# Patient Record
Sex: Female | Born: 1995 | Race: Black or African American | Hispanic: No | Marital: Single | Smoking: Never smoker
Health system: Southern US, Community
[De-identification: ages and names within clinical notes are randomized; demographics above are authoritative.]

## PROBLEM LIST (undated history)

## (undated) ENCOUNTER — Inpatient Hospital Stay (HOSPITAL_COMMUNITY): Payer: Self-pay

## (undated) DIAGNOSIS — J45909 Unspecified asthma, uncomplicated: Secondary | ICD-10-CM

## (undated) DIAGNOSIS — O24419 Gestational diabetes mellitus in pregnancy, unspecified control: Secondary | ICD-10-CM

## (undated) HISTORY — DX: Gestational diabetes mellitus in pregnancy, unspecified control: O24.419

## (undated) HISTORY — PX: NO PAST SURGERIES: SHX2092

---
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
## 2017-03-25 IMAGING — CR DG ANKLE COMPLETE 3+V*R*
1 series · 3 of 3 positions shown · non-contrast
Comparison: None.

CLINICAL DATA: Increasing right ankle pain with swelling after a
fall playing volleyball

EXAM:
RIGHT ANKLE - COMPLETE 3+ VIEW

[Series 1: x ankle ap right · 0.14mm/px · 3 of 3 slices shown]
[im 1/3]
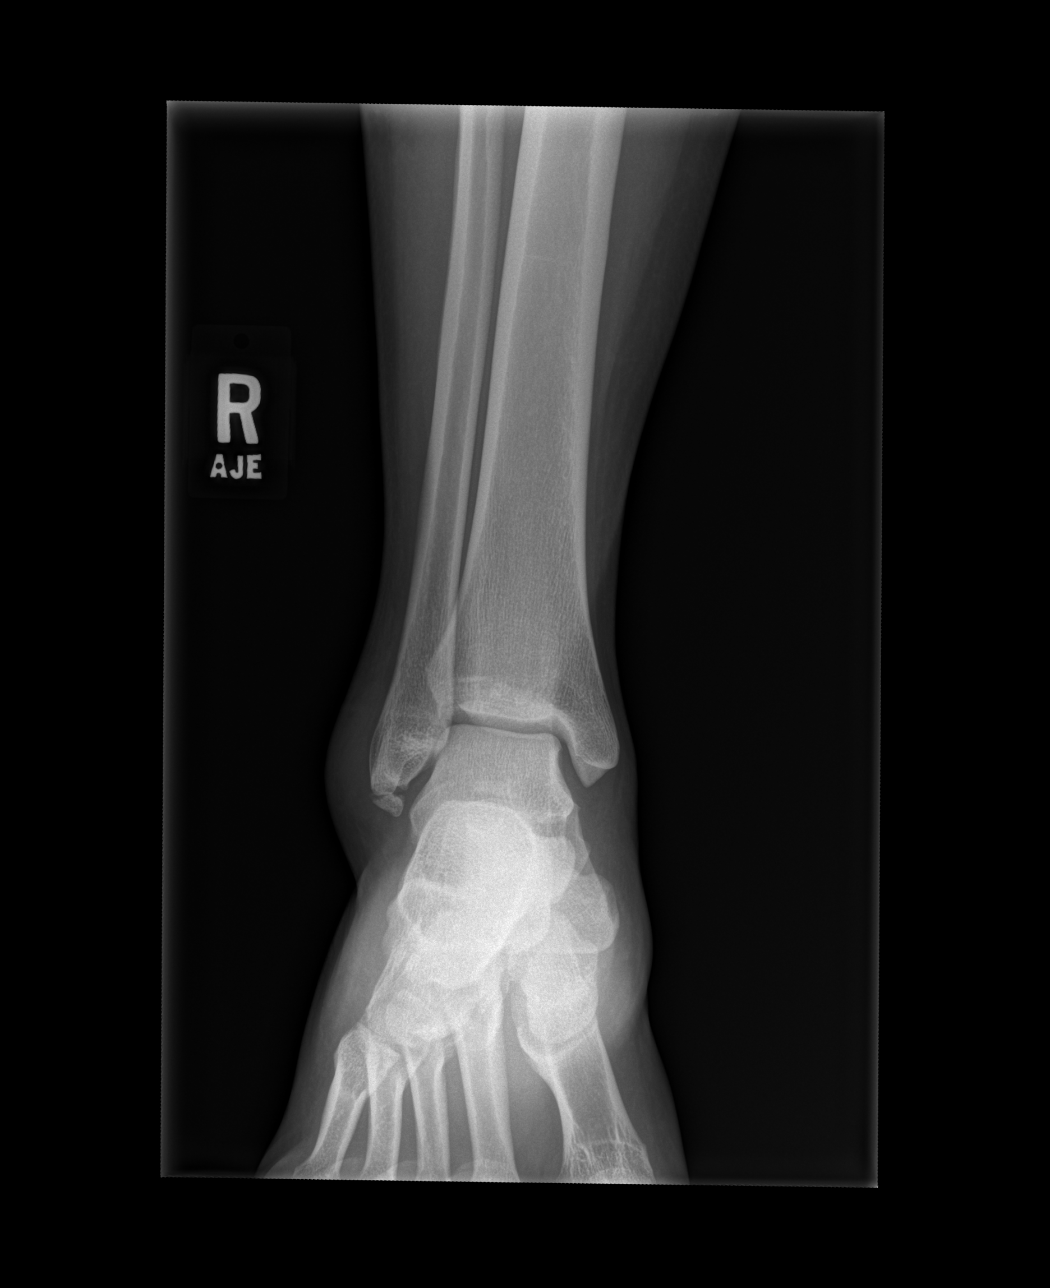
[im 2/3]
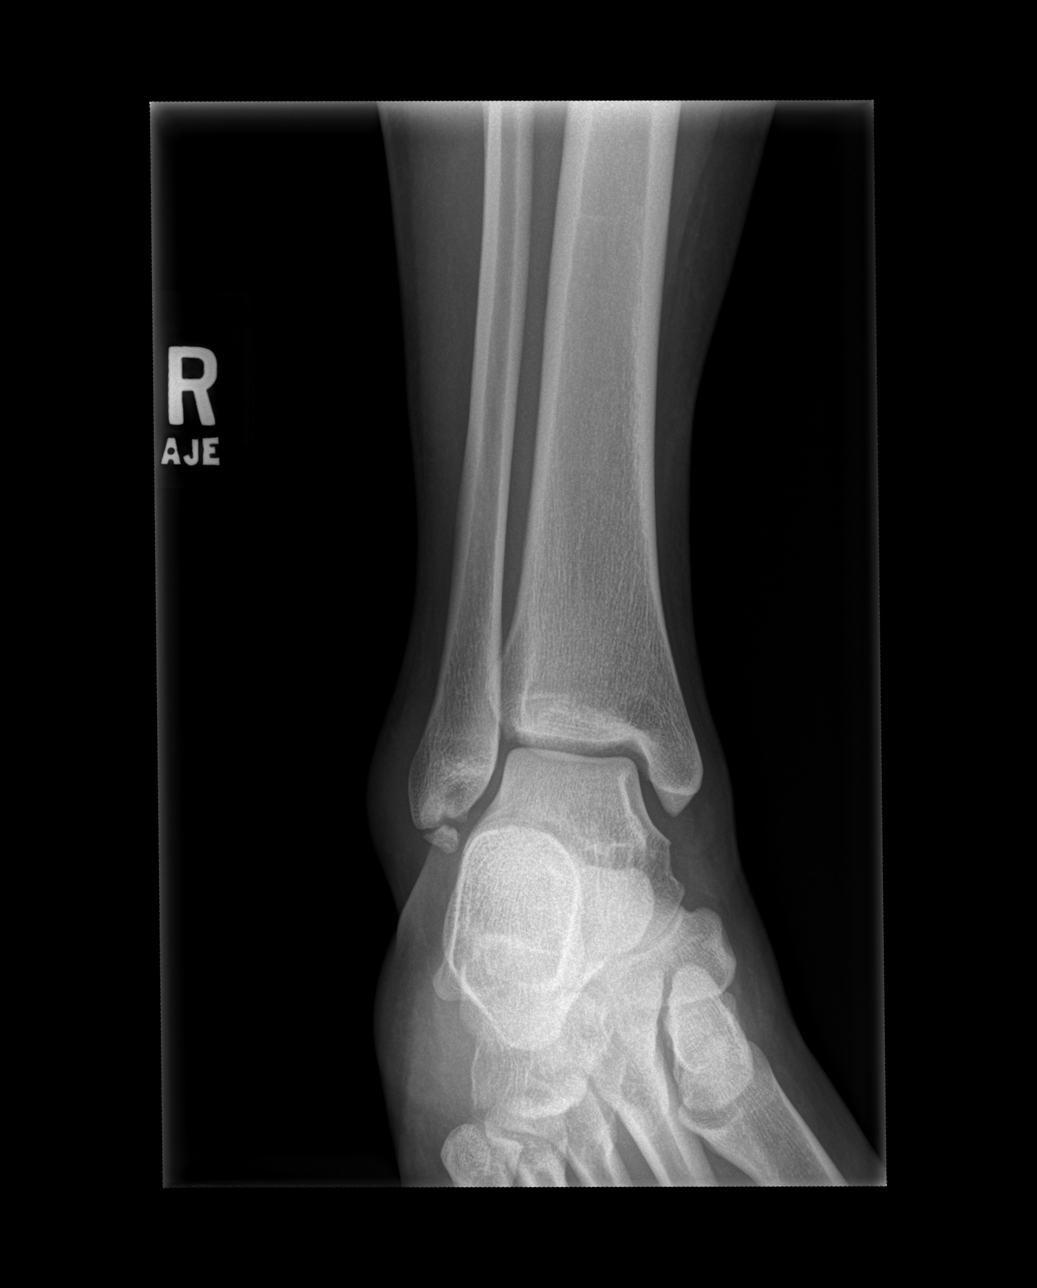
[im 3/3]
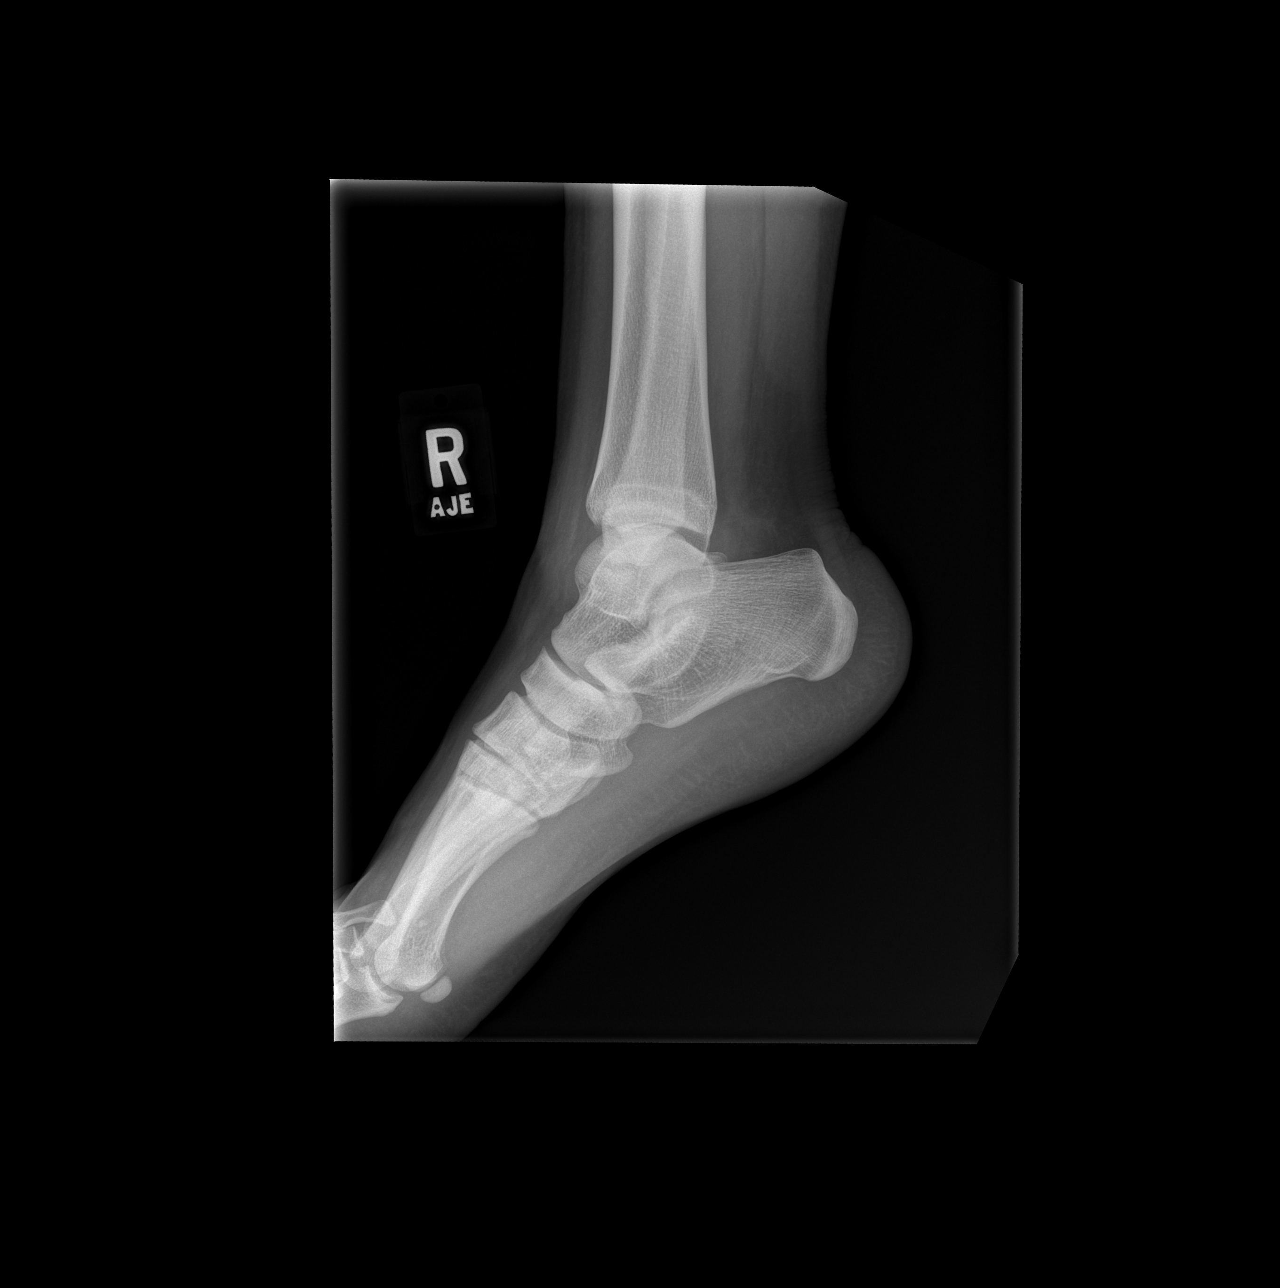

[3 of 3 positions shown; findings below may reference images not displayed]

FINDINGS: There is an avulsion fracture of the tip of the distal right fibula
with adjacent soft tissue swelling. The medial malleolus is
unremarkable. The ankle joint appears normal, and alignment is
normal.
IMPRESSION: Avulsion fracture of the tip the distal right fibula.

## 2019-03-04 ENCOUNTER — Other Ambulatory Visit: Payer: Self-pay

## 2019-03-04 ENCOUNTER — Ambulatory Visit
Admission: EM | Admit: 2019-03-04 | Discharge: 2019-03-04 | Disposition: A | Discharge status: Home / Self Care | Payer: BLUE CROSS/BLUE SHIELD | Attending: Family Medicine | Admitting: Family Medicine

## 2019-03-04 DIAGNOSIS — T23201A Burn of second degree of right hand, unspecified site, initial encounter: Secondary | ICD-10-CM

## 2019-03-04 DIAGNOSIS — X19XXXA Contact with other heat and hot substances, initial encounter: Secondary | ICD-10-CM

## 2019-03-04 MED ORDER — SILVER SULFADIAZINE 1 % EX CREA: 1.0000 "application " | TOPICAL_CREAM | Freq: Two times a day (BID) | CUTANEOUS | 0 refills | Status: DC

## 2019-03-04 NOTE — ED Triage Notes (Signed)
Patient complains of burn to her hand around 10pm. States that she was cleaning the grill at work and some of the cleaner splashed back on to her right index and pinky finger. Patient has a large blister and redness to her hand.

## 2019-03-04 NOTE — Discharge Instructions (Signed)
Medication as directed. ° °Take care ° °Dr. Daily Crate  °

## 2019-03-04 NOTE — ED Provider Notes (Signed)
[facility]    CSN: [id number] Arrival date & time: 03/04/19  1209  History   Chief Complaint Chief Complaint  Patient presents with  . Burn   HPI  23 year old female presents with a burn to her right hand which occurred at work.  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  Patient reports that last night around 10 PM she was cleaning a grill at work.  The burn was hot and some of the cleaner splashed back and burned her right hand (interdigital space between the fourth and fifth digits and on the dorsum of the fifth digit).  Patient has blisters present.  Associated redness.  Associated severe pain.  No fever.  No medications or interventions tried.  No other associated symptoms.  No other complaints  History reviewed and updated as below. PMH: Obesity, Acne  Past Surgical History:  Procedure Laterality Date  . NO PAST SURGERIES     OB History   No obstetric history on file.    Home Medications    Prior to Admission medications   Medication Sig Start Date End Date Taking? Authorizing Provider  minocycline (MINOCIN) 100 MG capsule Take 100 mg by mouth 2 (two) times daily. 02/18/19  Yes [provider]  tretinoin (RETIN-A) 0.025 % cream APPLY TOPICALLY TO FACE NIGHTLY. IF YOU ARE HAVING A LOT OF IRRITATION APPLY ONLY EVERY OTHER NIGHT 02/16/19  Yes [provider]  triamcinolone cream (KENALOG) 0.1 % APPLY TOPICALLY FOR AREAS OF ECZEMA UP TO TWICE DAILY AS NEEDED ONLY 02/16/19  Yes [provider]  silver sulfADIAZINE (SILVADENE) 1 % cream Apply 1 application topically 2 (two) times daily. 03/04/19   Coral Spikes, DO   Social History Social History   Tobacco Use  . Smoking status: Never Smoker  . Smokeless tobacco: Never Used  Substance Use Topics  . Alcohol use: Not Currently  . Drug use: Not Currently    Allergies   Patient has no known allergies.  Review of Systems Review of Systems  Constitutional: Negative.   Skin:       Burn  (right hand)   Physical Exam Triage Vital Signs ED Triage Vitals  Enc Vitals Group     BP 03/04/19 1226 121/85     Pulse Rate 03/04/19 1226 (!) 57     Resp 03/04/19 1226 18     Temp 03/04/19 1226 98.4 F (36.9 C)     Temp Source 03/04/19 1226 Oral     SpO2 03/04/19 1226 100 %     Weight 03/04/19 1223 215 lb (97.5 kg)     Height 03/04/19 1223 5\' 8"  (1.727 m)     Head Circumference --      Peak Flow --      Pain Score 03/04/19 1223 10     Pain Loc --      Pain Edu? --      Excl. in [place]? --    Updated Vital Signs BP 121/85 (BP Location: Left Arm)   Pulse (!) 57   Temp 98.4 F (36.9 C) (Oral)   Resp 18   Ht 5\' 8"  (1.727 m)   Wt 97.5 kg   LMP 02/25/2019   SpO2 100%   BMI 32.69 kg/m   Visual Acuity Right Eye Distance:   Left Eye Distance:   Bilateral Distance:    Right Eye Near:   Left Eye Near:    Bilateral Near:     Physical Exam Vitals signs and nursing note  reviewed.  Constitutional:      General: She is not in acute distress.    Appearance: Normal appearance. She is obese.  HENT:     Head: Normocephalic and atraumatic.  Eyes:     General:        Right eye: No discharge.        Left eye: No discharge.     Conjunctiva/sclera: Conjunctivae normal.  Pulmonary:     Effort: Pulmonary effort is normal. No respiratory distress.  Skin:    Comments: Right hand -large bullae noted on the dorsum of the right fifth digit.  Patient has additional small blisters between the fourth and fifth digits.  Neurological:     Mental Status: She is alert.  Psychiatric:        Mood and Affect: Mood normal.        Behavior: Behavior normal.    UC Treatments / Results  Labs (all labs ordered are listed, but only abnormal results are displayed) Labs Reviewed - No data to display  EKG   Radiology No results found.  Procedures Procedures (including critical care time) Needle drainage  Informed consent obtained.  Discussed possible risk of infection.  Cleaned with  Betadine.  21-gauge needle used to puncture bulla.  Serous drainage noted.  Patient tolerated the procedure well.  This was done for comfort.  Medications Ordered in UC Medications - No data to display  Initial Impression / Assessment and Plan / UC Course  I have reviewed the triage vital signs and the nursing notes.  Pertinent labs & imaging results that were available during my care of the patient were reviewed by me and considered in my medical decision making (see chart for details).    23 year old female presents with a partial-thickness burn.  Silvadene as directed.  Work note given.  Workmen's Comp. form filled out.  Final Clinical Impressions(s) / UC Diagnoses   Final diagnoses:  Partial thickness burn of right hand, unspecified site of hand, initial encounter     Discharge Instructions     Medication as directed.  Take care  Dr. Adriana Simasook    ED Prescriptions    Medication Sig Dispense Auth. Provider   silver sulfADIAZINE (SILVADENE) 1 % cream Apply 1 application topically 2 (two) times daily. 50 g Tommie Samsook, Makaelyn Aponte G, DO     Controlled Substance Prescriptions [place] Controlled Substance Registry consulted? Not Applicable   Tommie SamsCook, Ayauna Mcnay G, DO 03/04/19 1316

## 2019-06-27 ENCOUNTER — Ambulatory Visit: Payer: Self-pay

## 2019-06-28 ENCOUNTER — Ambulatory Visit: Payer: Self-pay | Admitting: Physician Assistant

## 2019-06-28 ENCOUNTER — Other Ambulatory Visit: Payer: Self-pay

## 2019-06-28 ENCOUNTER — Encounter: Payer: Self-pay | Admitting: Physician Assistant

## 2019-06-28 DIAGNOSIS — Z113 Encounter for screening for infections with a predominantly sexual mode of transmission: Secondary | ICD-10-CM

## 2019-06-28 DIAGNOSIS — A5901 Trichomonal vulvovaginitis: Secondary | ICD-10-CM

## 2019-06-28 LAB — WET PREP FOR TRICH, YEAST, CLUE
Trichomonas Exam: POSITIVE — AB
Yeast Exam: NEGATIVE

## 2019-06-28 MED ORDER — METRONIDAZOLE 500 MG PO TABS: 2000.0000 mg | ORAL_TABLET | Freq: Once | ORAL | 0 refills | Status: AC

## 2019-06-28 NOTE — Progress Notes (Signed)
STI clinic/screening visit  Subjective:  Ashley Weber is a 23 y.o. female being seen today for an STI screening visit. The patient reports they do not have symptoms.  Patient has the following medical conditions:  There are no active problems to display for this patient.    Chief Complaint  Patient presents with  . SEXUALLY TRANSMITTED DISEASE    HPI  Patient reports that she is not having any symptoms but would like a screening.  Using OCs for [facility] and last period was 06/04/2019, and normal.  See flowsheet for further details and programmatic requirements.    The following portions of the patient's history were reviewed and updated as appropriate: allergies, current medications, past medical history, past social history, past surgical history and problem list.  Objective:  There were no vitals filed for this visit.  Physical Exam Constitutional:      General: She is not in acute distress.    Appearance: Normal appearance. She is obese.  HENT:     Head: Normocephalic and atraumatic.     Mouth/Throat:     Mouth: Mucous membranes are moist.     Pharynx: Oropharynx is clear. No oropharyngeal exudate or posterior oropharyngeal erythema.  Eyes:     Conjunctiva/sclera: Conjunctivae normal.  Neck:     Musculoskeletal: Neck supple.  Pulmonary:     Effort: Pulmonary effort is normal.  Abdominal:     Palpations: Abdomen is soft. There is no mass.     Tenderness: There is no abdominal tenderness. There is no guarding or rebound.  Genitourinary:    General: Normal vulva.     Rectum: Normal.     Comments: External genitalia/pubic area without nits, lice, edema,erythema, lesions and inguinal adenopathy. Vagina with normal mucosa and small amount of white, frothy discharge, pH => 4.5. Cervix without visible lesions.  Uterus firm, mobile, nt, no masses, no CMT, no adnexal tenderness or fullness. Lymphadenopathy:     Cervical: No cervical adenopathy.  Skin:    General: Skin  is warm and dry.     Findings: No bruising, erythema, lesion or rash.  Neurological:     Mental Status: She is alert and oriented to person, place, and time.  Psychiatric:        Mood and Affect: Mood normal.        Behavior: Behavior normal.        Thought Content: Thought content normal.        Judgment: Judgment normal.       Assessment and Plan:  Ethell Blatchford is a 23 y.o. female presenting to the [facility] Department for STI screening  1. Screening for STD (sexually transmitted disease) Patient into clinic without symptoms.  Rec condoms with all sex. Await test results.  Counseled that RN will call if needs to RTC for further treatment once results are back. - WET PREP FOR [place], YEAST, CLUE - Chlamydia/Gonorrhea [place] [facility] - HIV [place] [facility] - Syphilis Serology, [facility]  2. Trichomonal vaginitis Will treat with Metronidazole 2 g po stat with food, no EtOH for 24 hr before and until 72 hr after completing medicine. No sex for 7 days and until after partner completes treatment. RTC for re-treatment if vomits < 2 hr after taking medicine. Rec using OTC antifungal cream if has itching after taking antibiotics. - metroNIDAZOLE (FLAGYL) 500 MG tablet; Take 4 tablets (2,000 mg total) by mouth once for 1 dose.  Dispense: 4 tablet; Refill: 0  No follow-ups on file.  No future appointments.  Jerene Dilling, PA

## 2019-07-08 ENCOUNTER — Telehealth: Payer: Self-pay

## 2019-07-08 DIAGNOSIS — A749 Chlamydial infection, unspecified: Secondary | ICD-10-CM

## 2019-07-08 NOTE — Telephone Encounter (Signed)
TC to patient. Verified ID via password/SS#. Informed of positive chlamydia. Patient states [facility] called in [facility] 1 gram the same day she was seen here at [facility]. Reports no sex for 7 days and partner was tx'd. Co to RTC in 2-3 months for TOC. Verbalized understanding Aileen Fass, RN

## 2022-09-03 ENCOUNTER — Other Ambulatory Visit: Payer: Self-pay

## 2022-09-03 ENCOUNTER — Emergency Department: Payer: BC Managed Care – PPO

## 2022-09-03 ENCOUNTER — Encounter: Payer: Self-pay | Admitting: Emergency Medicine

## 2022-09-03 ENCOUNTER — Emergency Department
Admission: EM | Admit: 2022-09-03 | Discharge: 2022-09-03 | Disposition: A | Discharge status: Home / Self Care | Payer: BC Managed Care – PPO | Attending: Emergency Medicine | Admitting: Emergency Medicine

## 2022-09-03 DIAGNOSIS — S61411A Laceration without foreign body of right hand, initial encounter: Secondary | ICD-10-CM | POA: Diagnosis not present

## 2022-09-03 DIAGNOSIS — Z23 Encounter for immunization: Secondary | ICD-10-CM | POA: Insufficient documentation

## 2022-09-03 DIAGNOSIS — W230XXA Caught, crushed, jammed, or pinched between moving objects, initial encounter: Secondary | ICD-10-CM | POA: Insufficient documentation

## 2022-09-03 DIAGNOSIS — S6991XA Unspecified injury of right wrist, hand and finger(s), initial encounter: Secondary | ICD-10-CM | POA: Diagnosis present

## 2022-09-03 MED ORDER — TETANUS-DIPHTH-ACELL PERTUSSIS 5-2.5-18.5 LF-MCG/0.5 IM SUSY
0.5000 mL | PREFILLED_SYRINGE | Freq: Once | INTRAMUSCULAR | Status: AC
  Administered 2022-09-03: 0.5 mL via INTRAMUSCULAR
  Filled 2022-09-03: qty 0.5

## 2022-09-03 MED ORDER — KETOROLAC TROMETHAMINE 30 MG/ML IJ SOLN
30.0000 mg | Freq: Once | INTRAMUSCULAR | Status: AC
  Administered 2022-09-03: 30 mg via INTRAMUSCULAR
  Filled 2022-09-03: qty 1

## 2022-09-03 MED ORDER — OXYCODONE HCL 5 MG PO TABS
5.0000 mg | ORAL_TABLET | Freq: Once | ORAL | Status: AC
  Administered 2022-09-03: 5 mg via ORAL
  Filled 2022-09-03: qty 1

## 2022-09-03 MED ORDER — LIDOCAINE HCL (PF) 1 % IJ SOLN
5.0000 mL | Freq: Once | INTRAMUSCULAR | Status: AC
  Administered 2022-09-03: 5 mL
  Filled 2022-09-03: qty 5

## 2022-09-03 NOTE — Discharge Instructions (Signed)
Gently wash the wound with soap and water.  It is okay to shower, but do not submerge in a bath or go swimming as it is healing.  Do not vigorously scrub.   Gently pat dry.   Once dry, then apply Neosporin or bacitracin or even Vaseline ointment to the area to act as a barrier to help prevent infection.  Please take Tylenol and ibuprofen/Advil for your pain.  It is safe to take them together, or to alternate them every few hours.  Take up to 1000mg  of Tylenol at a time, up to 4 times per day.  Do not take more than 4000 mg of Tylenol in 24 hours.  For ibuprofen, take 400-600 mg, 3 - 4 times per day.  We placed 1 stitch that will absorb on its own

## 2022-09-03 NOTE — ED Provider Notes (Signed)
[facility] Provider Note    Event Date/Time   First MD Initiated Contact with Patient 09/03/22 [phone]     (approximate)   History   Hand Injury   HPI  Ashley Weber is a 27 y.o. female who presents to the ED for evaluation of Hand Injury   Right hand dominant patient presents to the ED after accidentally shutting the car door on her right hand.  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  Denies any assault or intentional injury. Uncertain when her last tetanus was  Physical Exam   Triage Vital Signs: ED Triage Vitals  Enc Vitals Group     BP 09/03/22 0334 (!) 141/102     Pulse Rate 09/03/22 0334 94     Resp 09/03/22 0334 18     Temp 09/03/22 0334 97.9 F (36.6 C)     Temp Source 09/03/22 0334 Oral     SpO2 09/03/22 0334 100 %     Weight 09/03/22 0333 190 lb (86.2 kg)     Height 09/03/22 0333 5\' 8"  (1.727 m)     Head Circumference --      Peak Flow --      Pain Score 09/03/22 0333 7     Pain Loc --      Pain Edu? --      Excl. in [place]? --     Most recent vital signs: Vitals:   09/03/22 0334 09/03/22 0609  BP: (!) 141/102 (!) 122/90  Pulse: 94 68  Resp: 18 16  Temp: 97.9 F (36.6 C)   SpO2: 100% 100%    General: Awake, no distress.  CV:  Good peripheral perfusion.  Resp:  Normal effort.  Abd:  No distention.  MSK:   2-3 cm obliquely oriented laceration to the middle of the dorsum of the right hand.  Soft tissue swelling beneath this.  No visible bone or tendon.  Full range of motion to the fingers and with brisk capillary refill Neuro:  No focal deficits appreciated. Other:     ED Results / Procedures / Treatments   Labs (all labs ordered are listed, but only abnormal results are displayed) Labs Reviewed - No data to display  EKG   RADIOLOGY Plain film the right hand interpreted by me without evidence of fracture or dislocation  Official radiology report(s): DG Hand Complete  Right  Result Date: 09/03/2022 CLINICAL DATA:  Right hand injury with bleeding. EXAM: RIGHT HAND - COMPLETE 3+ VIEW COMPARISON:  None Available. FINDINGS: There is no evidence of fracture or dislocation. There is no evidence of arthropathy or other focal bone abnormality. Soft tissues are unremarkable. IMPRESSION: Negative. Electronically Signed   By: Jorje Guild M.D.   On: 09/03/2022 04:07    PROCEDURES and INTERVENTIONS:  .Marland KitchenLaceration Repair  Date/Time: 09/03/2022 6:21 AM  Performed by: Vladimir Crofts, MD Authorized by: Vladimir Crofts, MD   Consent:    Consent obtained:  Verbal   Consent given by:  Patient   Risks, benefits, and alternatives were discussed: yes   Anesthesia:    Anesthesia method:  Local infiltration   Local anesthetic:  Lidocaine 1% w/o epi Laceration details:    Location:  Hand   Hand location:  R hand, dorsum   Length (cm):  2.8 Exploration:    Hemostasis achieved with:  Direct pressure   Imaging obtained: x-ray     Imaging outcome: foreign body not noted  Treatment:    Area cleansed with:  Povidone-iodine   Amount of cleaning:  Standard   Irrigation solution:  Sterile saline Skin repair:    Repair method:  Sutures   Suture size:  4-0   Wound skin closure material used: Monocryl.   Suture technique:  Horizontal mattress   Number of sutures:  1 Approximation:    Approximation:  Close Repair type:    Repair type:  Simple Post-procedure details:    Dressing:  Open (no dressing)   Procedure completion:  Tolerated well, no immediate complications   Medications  ketorolac (TORADOL) 30 MG/ML injection 30 mg (30 mg Intramuscular Given 09/03/22 0356)  oxyCODONE (Oxy IR/ROXICODONE) immediate release tablet 5 mg (5 mg Oral Given 09/03/22 0356)  lidocaine (PF) (XYLOCAINE) 1 % injection 5 mL (5 mLs Infiltration Given 09/03/22 0606)  Tdap (BOOSTRIX) injection 0.5 mL (0.5 mLs Intramuscular Given 09/03/22 0607)     IMPRESSION / MDM / ASSESSMENT AND PLAN / ED  COURSE  I reviewed the triage vital signs and the nursing notes.  Differential diagnosis includes, but is not limited to, open fracture, closed fracture, laceration, tendinous injury  {Patient presents with symptoms of an acute illness or injury that is potentially life-threatening.  27 year old presents with an accidental hand injury, without evidence of fracture, but with a soft tissue laceration requiring suture repair and subsequently suitable for outpatient management.  X-ray without fracture.  Clinically with intact vasculature and tendinous structures.  No impaired range of motion.  Repaired, as above, with a single Monocryl horizontal mattress suture.  Provided Tdap.  Discussed wound care and return precautions      FINAL CLINICAL IMPRESSION(S) / ED DIAGNOSES   Final diagnoses:  Injury of right hand, initial encounter  Laceration of right hand without foreign body, initial encounter     Rx / DC Orders   ED Discharge Orders     None        Note:  This document was prepared using Dragon voice recognition software and may include unintentional dictation errors.   Vladimir Crofts, MD 09/03/22 [phone]

## 2022-09-03 NOTE — ED Triage Notes (Signed)
Patient ambulatory to triage with steady gait, without difficulty or distress noted; pt reports that she shut her rt hand in car door PTA; ace wrap in place

## 2022-09-11 ENCOUNTER — Encounter: Payer: Self-pay | Admitting: Emergency Medicine

## 2022-09-11 ENCOUNTER — Ambulatory Visit
Admission: EM | Admit: 2022-09-11 | Discharge: 2022-09-11 | Disposition: A | Discharge status: Home / Self Care | Payer: BC Managed Care – PPO | Attending: Emergency Medicine | Admitting: Emergency Medicine

## 2022-09-11 DIAGNOSIS — Z1152 Encounter for screening for COVID-19: Secondary | ICD-10-CM | POA: Diagnosis not present

## 2022-09-11 DIAGNOSIS — J069 Acute upper respiratory infection, unspecified: Secondary | ICD-10-CM | POA: Diagnosis not present

## 2022-09-11 LAB — RESP PANEL BY RT-PCR (RSV, FLU A&B, COVID)  RVPGX2
Influenza A by PCR: NEGATIVE
Influenza B by PCR: NEGATIVE
Resp Syncytial Virus by PCR: NEGATIVE
SARS Coronavirus 2 by RT PCR: NEGATIVE

## 2022-09-11 NOTE — Discharge Instructions (Signed)
Your symptoms today are most likely being caused by a virus and should steadily improve in time it can take up to 7 to 10 days before you truly start to see a turnaround however things will get better  COVID, flu and RSV testing negative    You can take Tylenol and/or Ibuprofen as needed for fever reduction and pain relief.   For cough: honey 1/2 to 1 teaspoon (you can dilute the honey in water or another fluid).  You can also use guaifenesin and dextromethorphan for cough. You can use a humidifier for chest congestion and cough.  If you don't have a humidifier, you can sit in the bathroom with the hot shower running.      For sore throat: try warm salt water gargles, cepacol lozenges, throat spray, warm tea or water with lemon/honey, popsicles or ice, or OTC cold relief medicine for throat discomfort.   For congestion: take a daily anti-histamine like Zyrtec, Claritin, and a oral decongestant, such as pseudoephedrine.  You can also use Flonase 1-2 sprays in each nostril daily.   It is important to stay hydrated: drink plenty of fluids (water, gatorade/powerade/pedialyte, juices, or teas) to keep your throat moisturized and help further relieve irritation/discomfort.

## 2022-09-11 NOTE — ED Provider Notes (Signed)
[facility]    CSN: [id number] Arrival date & time: 09/11/22  1528      History   Chief Complaint Chief Complaint  Patient presents with   Chills   Headache   Cough   Fever    HPI Ashley Weber is a 27 y.o. female.   Patient presents for evaluation of fever, chills, nasal congestion, and nonproductive cough and intermittent generalized headaches beginning 1 day ago.  Fever peaking at 102.  Decreased appetite but tolerating fluids.  Has attempted use of Tylenol sinus and Mucinex which has been somewhat helpful.  Known exposure to COVID while at work.  Denies ear pain, sore throat, shortness of breath or wheezing.  No pertinent medical history.    History reviewed. No pertinent past medical history.  There are no problems to display for this patient.   Past Surgical History:  Procedure Laterality Date   NO PAST SURGERIES      OB History   No obstetric history on file.      Home Medications    Prior to Admission medications   Medication Sig Start Date End Date Taking? Authorizing Provider  minocycline (MINOCIN) 100 MG capsule Take 100 mg by mouth 2 (two) times daily. 02/18/19   [provider]  silver sulfADIAZINE (SILVADENE) 1 % cream Apply 1 application topically 2 (two) times daily. 03/04/19   Coral Spikes, DO  tretinoin (RETIN-A) 0.025 % cream APPLY TOPICALLY TO FACE NIGHTLY. IF YOU ARE HAVING A LOT OF IRRITATION APPLY ONLY EVERY OTHER NIGHT 02/16/19   [provider]  triamcinolone cream (KENALOG) 0.1 % APPLY TOPICALLY FOR AREAS OF ECZEMA UP TO TWICE DAILY AS NEEDED ONLY 02/16/19   [provider]    Family History No family history on file.  Social History Social History   Tobacco Use   Smoking status: Never   Smokeless tobacco: Never  Vaping Use   Vaping Use: Never used  Substance Use Topics   Alcohol use: Not Currently   Drug use: Not Currently     Allergies   Patient has no known allergies.   Review of  Systems Review of Systems  Constitutional:  Positive for chills and fever. Negative for activity change, appetite change, diaphoresis, fatigue and unexpected weight change.  HENT:  Positive for congestion. Negative for dental problem, drooling, ear discharge, ear pain, facial swelling, hearing loss, mouth sores, nosebleeds, postnasal drip, rhinorrhea, sinus pressure, sinus pain, sneezing, sore throat, tinnitus, trouble swallowing and voice change.   Respiratory:  Positive for cough. Negative for apnea, choking, chest tightness, shortness of breath, wheezing and stridor.   Cardiovascular: Negative.   Gastrointestinal: Negative.   Skin: Negative.   Neurological:  Positive for headaches. Negative for dizziness, tremors, seizures, syncope, facial asymmetry, speech difficulty, weakness, light-headedness and numbness.     Physical Exam Triage Vital Signs ED Triage Vitals  Enc Vitals Group     BP 09/11/22 1557 (!) 152/89     Pulse Rate 09/11/22 1557 87     Resp 09/11/22 1557 18     Temp 09/11/22 1557 99.6 F (37.6 C)     Temp Source 09/11/22 1557 Oral     SpO2 09/11/22 1557 98 %     Weight --      Height --      Head Circumference --      Peak Flow --      Pain Score 09/11/22 1556 0     Pain Loc --  Pain Edu? --      Excl. in GC? --    No data found.  Updated Vital Signs BP (!) 152/89 (BP Location: Right Arm)   Pulse 87   Temp 99.6 F (37.6 C) (Oral)   Resp 18   LMP 09/03/2022 (Approximate)   SpO2 98%   Visual Acuity Right Eye Distance:   Left Eye Distance:   Bilateral Distance:    Right Eye Near:   Left Eye Near:    Bilateral Near:     Physical Exam Constitutional:      Appearance: Normal appearance.  HENT:     Right Ear: Tympanic membrane, ear canal and external ear normal.     Left Ear: Tympanic membrane, ear canal and external ear normal.     Nose: Congestion and rhinorrhea present.     Mouth/Throat:     Mouth: Mucous membranes are moist.     Pharynx:  Posterior oropharyngeal erythema present.  Cardiovascular:     Rate and Rhythm: Normal rate and regular rhythm.     Pulses: Normal pulses.     Heart sounds: Normal heart sounds.  Pulmonary:     Effort: Pulmonary effort is normal.     Breath sounds: Normal breath sounds.  Neurological:     Mental Status: She is alert and oriented to person, place, and time. Mental status is at baseline.  Psychiatric:        Mood and Affect: Mood normal.        Behavior: Behavior normal.      UC Treatments / Results  Labs (all labs ordered are listed, but only abnormal results are displayed) Labs Reviewed  RESP PANEL BY RT-PCR (RSV, FLU A&B, COVID)  [id number]    EKG   Radiology No results found.  Procedures Procedures (including critical care time)  Medications Ordered in UC Medications - No data to display  Initial Impression / Assessment and Plan / UC Course  I have reviewed the triage vital signs and the nursing notes.  Pertinent labs & imaging results that were available during my care of the patient were reviewed by me and considered in my medical decision making (see chart for details).  Viral URI with cough  Patient is in no signs of distress nor toxic appearing.  Vital signs are stable.  Low suspicion for pneumonia, pneumothorax or bronchitis and therefore will defer imaging.  COVID, flu and RSV testing negative. May use additional over-the-counter medications as needed for supportive care.  May follow-up with urgent care as needed if symptoms persist or worsen.  Note given.   Final Clinical Impressions(s) / UC Diagnoses   Final diagnoses:  Viral URI with cough     Discharge Instructions      Your symptoms today are most likely being caused by a virus and should steadily improve in time it can take up to 7 to 10 days before you truly start to see a turnaround however things will get better  COVID, flu and RSV testing negative    You can take Tylenol and/or Ibuprofen as  needed for fever reduction and pain relief.   For cough: honey 1/2 to 1 teaspoon (you can dilute the honey in water or another fluid).  You can also use guaifenesin and dextromethorphan for cough. You can use a humidifier for chest congestion and cough.  If you don't have a humidifier, you can sit in the bathroom with the hot shower running.      For sore throat:  try warm salt water gargles, cepacol lozenges, throat spray, warm tea or water with lemon/honey, popsicles or ice, or OTC cold relief medicine for throat discomfort.   For congestion: take a daily anti-histamine like Zyrtec, Claritin, and a oral decongestant, such as pseudoephedrine.  You can also use Flonase 1-2 sprays in each nostril daily.   It is important to stay hydrated: drink plenty of fluids (water, gatorade/powerade/pedialyte, juices, or teas) to keep your throat moisturized and help further relieve irritation/discomfort.     ED Prescriptions   None    PDMP not reviewed this encounter.   Hans Eden, NP 09/11/22 1710

## 2022-09-11 NOTE — ED Triage Notes (Signed)
Pt presents with a cough, fever, headache and chills since yesterday. Pt was exposed to Covid at work.

## 2023-02-20 ENCOUNTER — Emergency Department (HOSPITAL_COMMUNITY)
Admission: EM | Admit: 2023-02-20 | Discharge: 2023-02-20 | Disposition: A | Discharge status: Home / Self Care | Payer: BC Managed Care – PPO | Attending: Emergency Medicine | Admitting: Emergency Medicine

## 2023-02-20 ENCOUNTER — Other Ambulatory Visit: Payer: Self-pay

## 2023-02-20 DIAGNOSIS — R519 Headache, unspecified: Secondary | ICD-10-CM | POA: Insufficient documentation

## 2023-02-20 DIAGNOSIS — H1031 Unspecified acute conjunctivitis, right eye: Secondary | ICD-10-CM | POA: Insufficient documentation

## 2023-02-20 LAB — COMPREHENSIVE METABOLIC PANEL
ALT: 27 U/L (ref 0–44)
AST: 29 U/L (ref 15–41)
Albumin: 3.4 g/dL — ABNORMAL LOW (ref 3.5–5.0)
Alkaline Phosphatase: 47 U/L (ref 38–126)
Anion gap: 9 (ref 5–15)
BUN: 14 mg/dL (ref 6–20)
CO2: 23 mmol/L (ref 22–32)
Calcium: 8.5 mg/dL — ABNORMAL LOW (ref 8.9–10.3)
Chloride: 104 mmol/L (ref 98–111)
Creatinine, Ser: 1.15 mg/dL — ABNORMAL HIGH (ref 0.44–1.00)
GFR, Estimated: >60 mL/min (ref >60)
Glucose, Bld: 113 mg/dL — ABNORMAL HIGH (ref 70–99)
Potassium: 3.8 mmol/L (ref 3.5–5.1)
Sodium: 136 mmol/L (ref 135–145)
Total Bilirubin: 0.5 mg/dL (ref 0.3–1.2)
Total Protein: 6 g/dL — ABNORMAL LOW (ref 6.5–8.1)

## 2023-02-20 LAB — CBC WITH DIFFERENTIAL/PLATELET
Abs Immature Granulocytes: 0.01 10*3/uL (ref 0.00–0.07)
Basophils Absolute: 0 10*3/uL (ref 0.0–0.1)
Basophils Relative: 1 %
Eosinophils Absolute: 0.2 10*3/uL (ref 0.0–0.5)
Eosinophils Relative: 3 %
HCT: 41.9 % (ref 36.0–46.0)
Hemoglobin: 13.6 g/dL (ref 12.0–15.0)
Immature Granulocytes: 0 %
Lymphocytes Relative: 35 %
Lymphs Abs: 2.4 10*3/uL (ref 0.7–4.0)
MCH: 28.8 pg (ref 26.0–34.0)
MCHC: 32.5 g/dL (ref 30.0–36.0)
MCV: 88.6 fL (ref 80.0–100.0)
Monocytes Absolute: 0.4 10*3/uL (ref 0.1–1.0)
Monocytes Relative: 6 %
Neutro Abs: 3.9 10*3/uL (ref 1.7–7.7)
Neutrophils Relative %: 55 %
Platelets: 256 10*3/uL (ref 150–400)
RBC: 4.73 MIL/uL (ref 3.87–5.11)
RDW: 13 % (ref 11.5–15.5)
WBC: 6.9 10*3/uL (ref 4.0–10.5)
nRBC: 0 % (ref 0.0–0.2)

## 2023-02-20 LAB — HCG, QUANTITATIVE, PREGNANCY: hCG, Beta Chain, Quant, S: <1 m[IU]/mL (ref <5)

## 2023-02-20 MED ORDER — FLUORESCEIN SODIUM 1 MG OP STRP
ORAL_STRIP | OPHTHALMIC | Status: AC
  Filled 2023-02-20: qty 2

## 2023-02-20 MED ORDER — TETRACAINE HCL 0.5 % OP SOLN
2.0000 [drp] | Freq: Once | OPHTHALMIC | Status: AC
  Administered 2023-02-20: 2 [drp] via OPHTHALMIC
  Filled 2023-02-20: qty 4

## 2023-02-20 MED ORDER — KETOROLAC TROMETHAMINE 15 MG/ML IJ SOLN
15.0000 mg | Freq: Once | INTRAMUSCULAR | Status: AC
  Administered 2023-02-20: 15 mg via INTRAVENOUS
  Filled 2023-02-20: qty 1

## 2023-02-20 MED ORDER — METOCLOPRAMIDE HCL 5 MG/ML IJ SOLN
10.0000 mg | Freq: Once | INTRAMUSCULAR | Status: AC
  Administered 2023-02-20: 10 mg via INTRAVENOUS
  Filled 2023-02-20: qty 2

## 2023-02-20 MED ORDER — ACETAMINOPHEN 500 MG PO TABS
1000.0000 mg | ORAL_TABLET | Freq: Once | ORAL | Status: AC
  Administered 2023-02-20: 1000 mg via ORAL
  Filled 2023-02-20: qty 2

## 2023-02-20 NOTE — ED Triage Notes (Addendum)
Patient reports intermittent frontal headache for 2 days unrelieved by OTC Tylenol  , denies head injury , no emesis or photophobia . She adds mild right eye redness.

## 2023-02-20 NOTE — Discharge Instructions (Signed)
You were seen in the emergency department for your headache and your eye redness.  Your eye redness is likely related to either allergies or a viral infection and you had normal eye pressures.  You had no signs of stroke or serious cause of her headaches.  You can use cool compresses to help with the redness of your eye and you can take Tylenol and Motrin as needed for your headaches.  You should follow-up with your primary doctor to have your symptoms rechecked.  You should return to the emergency department if you are losing vision in your eye, you have numbness or weakness in your arms or legs, you have repetitive vomiting or if you have any other new or concerning symptoms.

## 2023-02-20 NOTE — ED Provider Notes (Signed)
[place] EMERGENCY DEPARTMENT AT [facility] Provider Note   CSN: [id number] Arrival date & time: 02/20/23  1938     History  Chief Complaint  Patient presents with   Headache    Ashley Weber is a 27 y.o. female.  Patient is a 27 year old female with no significant past medical history presenting to the emergency department with a headache.  Patient states that she had a headache for 2 days earlier in the week that resolved and then reports that the headache came back today.  She states that she has some associated right eye pain and redness to her eye that started today which prompted her to come to the emergency department.  She states that she does not normally get headaches.  She states that her headache was gradual in onset and has no associated nausea, vomiting, numbness or weakness.  She denies any vision changes.  She denies wearing contacts or glasses.  She denies any recent fever, cough, congestion or sore throat or drainage from her eye.  The history is provided by the patient.  Headache      Home Medications Prior to Admission medications   Medication Sig Start Date End Date Taking? Authorizing Provider  minocycline (MINOCIN) 100 MG capsule Take 100 mg by mouth 2 (two) times daily. 02/18/19   [provider]  silver sulfADIAZINE (SILVADENE) 1 % cream Apply 1 application topically 2 (two) times daily. 03/04/19   Tommie Sams, DO  tretinoin (RETIN-A) 0.025 % cream APPLY TOPICALLY TO FACE NIGHTLY. IF YOU ARE HAVING A LOT OF IRRITATION APPLY ONLY EVERY OTHER NIGHT 02/16/19   [provider]  triamcinolone cream (KENALOG) 0.1 % APPLY TOPICALLY FOR AREAS OF ECZEMA UP TO TWICE DAILY AS NEEDED ONLY 02/16/19   [provider]      Allergies    Patient has no known allergies.    Review of Systems   Review of Systems  Neurological:  Positive for headaches.    Physical Exam Updated Vital Signs BP (!) 139/94 (BP Location: Right Arm)   Pulse  94   Temp 99 F (37.2 C) (Oral)   Resp 20   LMP 02/17/2023   SpO2 99%  Physical Exam Vitals and nursing note reviewed.  Constitutional:      General: She is not in acute distress.    Appearance: She is well-developed.  HENT:     Head: Normocephalic and atraumatic.     Comments: No temporal tenderness to palpation bilaterally    Mouth/Throat:     Mouth: Mucous membranes are moist.     Pharynx: Oropharynx is clear.  Eyes:     Extraocular Movements: Extraocular movements intact.     Pupils: Pupils are equal, round, and reactive to light.     Comments: Mild right eye conjunctival injection, no drainage  Cardiovascular:     Rate and Rhythm: Normal rate and regular rhythm.  Pulmonary:     Breath sounds: Normal breath sounds.  Abdominal:     Palpations: Abdomen is soft.  Musculoskeletal:        General: Normal range of motion.     Cervical back: Normal range of motion and neck supple.  Skin:    General: Skin is warm and dry.  Neurological:     Mental Status: She is alert and oriented to person, place, and time.     GCS: GCS eye subscore is 4. GCS verbal subscore is 5. GCS motor subscore is 6.  Cranial Nerves: No cranial nerve deficit, dysarthria or facial asymmetry.     Sensory: No sensory deficit.     Motor: No weakness.     Coordination: Coordination normal.  Psychiatric:        Mood and Affect: Mood normal.        Speech: Speech normal.        Behavior: Behavior normal.     ED Results / Procedures / Treatments   Labs (all labs ordered are listed, but only abnormal results are displayed) Labs Reviewed  COMPREHENSIVE METABOLIC PANEL - Abnormal; Notable for the following components:      Result Value   Glucose, Bld 113 (*)    Creatinine, Ser 1.15 (*)    Calcium 8.5 (*)    Total Protein 6.0 (*)    Albumin 3.4 (*)    All other components within normal limits  HCG, QUANTITATIVE, PREGNANCY  CBC WITH DIFFERENTIAL/PLATELET    EKG None  Radiology No results  found.  Procedures Procedures    Medications Ordered in ED Medications  fluorescein 1 MG ophthalmic strip (  Not Given 02/20/23 2218)  ketorolac (TORADOL) 15 MG/ML injection 15 mg (15 mg Intravenous Given 02/20/23 2143)  metoCLOPramide (REGLAN) injection 10 mg (10 mg Intravenous Given 02/20/23 2144)  acetaminophen (TYLENOL) tablet 1,000 mg (1,000 mg Oral Given 02/20/23 2143)  tetracaine (PONTOCAINE) 0.5 % ophthalmic solution 2 drop (2 drops Both Eyes Given by Other 02/20/23 2218)    ED Course/ Medical Decision Making/ A&P Clinical Course as of 02/20/23 2249  Fri Feb 20, 2023  2202 IOP R 14, IOP L 18 [VK]  2248 Upon reassessment, patient's headache has resolved. She is stable for discharge home with outpatient follow up. [VK]    Clinical Course User Index [VK] Rexford Maus, DO                             Medical Decision Making This patient presents to the ED with chief complaint(s) of headache, eye pain with no pertinent past medical history which further complicates the presenting complaint. The complaint involves an extensive differential diagnosis and also carries with it a high risk of complications and morbidity.    The differential diagnosis includes migraine headache, tension headache, conjunctivitis, acute angle-closure glaucoma, no vision changes or temporal tenderness making temporal arteritis unlikely, no fever or meningismus making meningitis unlikely, headache was not sudden onset without focal neurologic deficits making ICH or mass effect unlikely  Additional history obtained: Additional history obtained from N/A Records reviewed N/A  ED Course and Reassessment: On patient's arrival to the emergency department she is hemodynamically stable in no acute distress.  She was initially evaluated in triage and had labs including hCG performed that were within normal range.  The patient will have ocular pressures and visual acuity performed in the setting of her eye redness  with headache and she will be given a headache cocktail and will be closely reassessed.  Independent labs interpretation:  The following labs were independently interpreted: within normal range  Independent visualization of imaging: - N/A  Consultation: - Consulted or discussed management/test interpretation w/ external professional: N/A  Consideration for admission or further workup: Patient has no emergent conditions requiring admission or further work-up at this time and is stable for discharge home with primary care follow-up  Social Determinants of health: N/A    Amount and/or Complexity of Data Reviewed Labs: ordered.  Risk OTC drugs. Prescription  drug management.          Final Clinical Impression(s) / ED Diagnoses Final diagnoses:  Acute nonintractable headache, unspecified headache type  Acute conjunctivitis of right eye, unspecified acute conjunctivitis type    Rx / DC Orders ED Discharge Orders     None         Rexford Maus, DO 02/20/23 2249

## 2023-03-28 ENCOUNTER — Encounter (HOSPITAL_COMMUNITY): Payer: Self-pay

## 2023-03-28 ENCOUNTER — Other Ambulatory Visit: Payer: Self-pay

## 2023-03-28 ENCOUNTER — Emergency Department (HOSPITAL_COMMUNITY)
Admission: EM | Admit: 2023-03-28 | Discharge: 2023-03-28 | Disposition: A | Discharge status: Home / Self Care | Payer: BC Managed Care – PPO | Attending: Emergency Medicine | Admitting: Emergency Medicine

## 2023-03-28 ENCOUNTER — Emergency Department (HOSPITAL_COMMUNITY): Payer: BC Managed Care – PPO

## 2023-03-28 DIAGNOSIS — R519 Headache, unspecified: Secondary | ICD-10-CM | POA: Diagnosis not present

## 2023-03-28 DIAGNOSIS — H748X1 Other specified disorders of right middle ear and mastoid: Secondary | ICD-10-CM

## 2023-03-28 NOTE — ED Triage Notes (Signed)
Pt came in via POV Rt ear was ringing yesterday & a little bit of a HA yesterday & then this morning she woke up & saw blood on her pillow & there was dried blood coming out of her Rt ear. A/Ox4, rates the pressure in that ear a 3/10.

## 2023-03-28 NOTE — Discharge Instructions (Signed)
Avoid swimming or getting water in your ear.  The eardrum injury should heal on its own.  Follow-up with an ENT doctor to be rechecked if the symptoms persist or you have difficulty with your hearing
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
## 2023-08-19 NOTE — L&D Delivery Note (Signed)
 OB/GYN Faculty Practice Delivery Note  Ashley Weber is a 28 y.o. G1P0000 s/p NVSB at [redacted]w[redacted]d. She was admitted for IOL for severe IUGR .   ROM: 10h 49m with clear fluid GBS Status: GBS with adequate treatment Maximum Maternal Temperature: 98.0  Labor Progress: IOL with progression to complete s/p cytotec , AROM, pitocin   Delivery Date/Time: 02/25/2024 @ 1255 Delivery: Called to room and patient was complete and pushing. Head delivered OA to LOA. No nuchal cord present. Shoulder and body delivered in usual fashion. Infant with spontaneous cry, placed on mother's abdomen, dried and stimulated. Cord clamped x 2 after 2-minute delay, and cut by  FOB. Cord blood drawn. Placenta delivered spontaneously, intact, with 3-vessel cord. Fundus firm with massage and Pitocin . Labia, perineum, vagina, and cervix inspected, 1st degree laceration identified and repaired with 3.0 vicryl, excellent hemostasis and approximation noted.   Placenta: spontaneous, Shultz, complete  Complications: none Lacerations: 1st degree laceration identified and repaired with 3.0 vicryl, excellent hemostasis and approximation noted EBL: 60 Analgesia: epidural   Postpartum Planning [ X] transfer orders to [facility].Gant ] discharge summary started & shared [facility].Gant ] message to sent to schedule follow-up  [ X] lists updated  Infant: [name] girl  APGARs   pending  Camie Rote, MSN, CNM, RNC-OB Certified Nurse Midwife, [facility] 02/25/2024 1:24 PM

## 2023-09-03 LAB — PREGNANCY, URINE: Preg Test, Ur: POSITIVE

## 2023-09-07 ENCOUNTER — Other Ambulatory Visit: Payer: Self-pay

## 2023-09-07 ENCOUNTER — Encounter (HOSPITAL_COMMUNITY): Payer: Self-pay | Admitting: Obstetrics and Gynecology

## 2023-09-07 ENCOUNTER — Inpatient Hospital Stay (HOSPITAL_COMMUNITY)
Admission: AD | Admit: 2023-09-07 | Discharge: 2023-09-07 | Disposition: A | Discharge status: Home / Self Care | Payer: BC Managed Care – PPO | Attending: Obstetrics and Gynecology | Admitting: Obstetrics and Gynecology

## 2023-09-07 DIAGNOSIS — K529 Noninfective gastroenteritis and colitis, unspecified: Secondary | ICD-10-CM | POA: Diagnosis present

## 2023-09-07 DIAGNOSIS — Z713 Dietary counseling and surveillance: Secondary | ICD-10-CM | POA: Insufficient documentation

## 2023-09-07 HISTORY — DX: Unspecified asthma, uncomplicated: J45.909

## 2023-09-07 LAB — COMPREHENSIVE METABOLIC PANEL
ALT: 23 U/L (ref 0–44)
AST: 23 U/L (ref 15–41)
Albumin: 3.8 g/dL (ref 3.5–5.0)
Alkaline Phosphatase: 42 U/L (ref 38–126)
Anion gap: 11 (ref 5–15)
BUN: 18 mg/dL (ref 6–20)
CO2: 19 mmol/L — ABNORMAL LOW (ref 22–32)
Calcium: 9.3 mg/dL (ref 8.9–10.3)
Chloride: 105 mmol/L (ref 98–111)
Creatinine, Ser: 0.82 mg/dL (ref 0.44–1.00)
GFR, Estimated: >60 mL/min (ref >60)
Glucose, Bld: 136 mg/dL — ABNORMAL HIGH (ref 70–99)
Potassium: 3.9 mmol/L (ref 3.5–5.1)
Sodium: 135 mmol/L (ref 135–145)
Total Bilirubin: 0.8 mg/dL (ref 0.0–1.2)
Total Protein: 7.4 g/dL (ref 6.5–8.1)

## 2023-09-07 LAB — URINALYSIS, ROUTINE W REFLEX MICROSCOPIC
Bilirubin Urine: NEGATIVE
Glucose, UA: NEGATIVE mg/dL
Hgb urine dipstick: NEGATIVE
Ketones, ur: 15 mg/dL — AB
Leukocytes,Ua: NEGATIVE
Nitrite: NEGATIVE
Protein, ur: 100 mg/dL — AB
Specific Gravity, Urine: >1.03 — ABNORMAL HIGH (ref 1.005–1.030)
pH: 6 (ref 5.0–8.0)

## 2023-09-07 LAB — URINALYSIS, MICROSCOPIC (REFLEX): RBC / HPF: NONE SEEN RBC/hpf (ref 0–5)

## 2023-09-07 MED ORDER — ONDANSETRON 4 MG PO TBDP
8.0000 mg | ORAL_TABLET | Freq: Once | ORAL | Status: AC
  Administered 2023-09-07: 8 mg via ORAL
  Filled 2023-09-07: qty 2

## 2023-09-07 MED ORDER — LOPERAMIDE HCL 2 MG PO CAPS: 2.0000 mg | ORAL_CAPSULE | Freq: Four times a day (QID) | ORAL | 0 refills | Status: DC | PRN

## 2023-09-07 MED ORDER — LOPERAMIDE HCL 2 MG PO CAPS
4.0000 mg | ORAL_CAPSULE | Freq: Once | ORAL | Status: AC
  Administered 2023-09-07: 4 mg via ORAL
  Filled 2023-09-07: qty 2

## 2023-09-07 MED ORDER — LACTATED RINGERS IV BOLUS
1000.0000 mL | Freq: Once | INTRAVENOUS | Status: AC
  Administered 2023-09-07: 1000 mL via INTRAVENOUS

## 2023-09-07 MED ORDER — PROMETHAZINE HCL 25 MG PO TABS: 25.0000 mg | ORAL_TABLET | Freq: Four times a day (QID) | ORAL | 0 refills | Status: DC | PRN

## 2023-09-07 MED ORDER — FAMOTIDINE 20 MG PO TABS
40.0000 mg | ORAL_TABLET | Freq: Once | ORAL | Status: AC
  Administered 2023-09-07: 40 mg via ORAL
  Filled 2023-09-07: qty 2

## 2023-09-07 MED ORDER — FAMOTIDINE 20 MG PO TABS: 20.0000 mg | ORAL_TABLET | Freq: Two times a day (BID) | ORAL | 0 refills | Status: DC | PRN

## 2023-09-07 NOTE — MAU Provider Note (Signed)
None     S [name] is a 28 y.o. G1P0 pregnant female at [redacted]w[redacted]d who presents to MAU today with complaint of NVD. Reports sharp abdominal pain since last night. Endorses sick contact, similar GI symptoms. Reports 5 each - vomiting and diarrhea. Denies VB.   Has not yet established care. VOP at [facility].   Pertinent items noted in HPI and remainder of comprehensive ROS otherwise negative.   O BP 138/79 (BP Location: Right Arm)   Pulse 93   Temp (!) 97.5 F (36.4 C) (Oral)   Resp 20   Ht 5\' 8"  (1.727 m)   Wt 108.9 kg   LMP 06/08/2023   SpO2 100%   BMI 36.51 kg/m  Physical Exam Vitals reviewed.  Constitutional:      General: She is not in acute distress.    Appearance: Normal appearance. She is ill-appearing. She is not toxic-appearing or diaphoretic.  HENT:     Head: Normocephalic.  Cardiovascular:     Rate and Rhythm: Normal rate.     Pulses: Normal pulses.     Heart sounds: Normal heart sounds.  Pulmonary:     Effort: Pulmonary effort is normal.  Skin:    General: Skin is warm and dry.     Capillary Refill: Capillary refill takes less than 2 seconds.  Neurological:     Mental Status: She is alert and oriented to person, place, and time.  Psychiatric:        Mood and Affect: Mood normal.        Behavior: Behavior normal.        Thought Content: Thought content normal.        Judgment: Judgment normal.      MDM: Patient history UA Labs IV and fluids Medications  MAU Course:  A Gastroenteritis - Plan: Discharge patient  CMP unremarkable, CO2 slightly decreased 2/2 emesis x5.  Good response to IV fluid hydration, emesis and GI meds.  Medical screening exam complete  P - Counseled on BRAT diet. - Prescriptions sent to preferred pharmacy: phenergan, pepcid and immodium.   Discharge from MAU in stable condition with routine precautions Preferred OB office for prenatal care.   Allergies as of 09/07/2023       Reactions    Peanut-containing Drug Products Hives, Swelling        Medication List     STOP taking these medications    minocycline 100 MG capsule Commonly known as: MINOCIN   silver sulfADIAZINE 1 % cream Commonly known as: SILVADENE   tretinoin 0.025 % cream Commonly known as: RETIN-A       TAKE these medications    famotidine 20 MG tablet Commonly known as: Pepcid Take 1 tablet (20 mg total) by mouth 2 (two) times daily as needed for heartburn or indigestion.   folic acid 1 MG tablet Commonly known as: FOLVITE Take 1 mg by mouth daily.   loperamide 2 MG capsule Commonly known as: IMODIUM Take 1 capsule (2 mg total) by mouth 4 (four) times daily as needed for diarrhea or loose stools.   prenatal multivitamin Tabs tablet Take 1 tablet by mouth daily at 12 noon.   promethazine 25 MG tablet Commonly known as: PHENERGAN Take 1 tablet (25 mg total) by mouth every 6 (six) hours as needed for nausea or vomiting.   triamcinolone cream 0.1 % Commonly known as: KENALOG APPLY TOPICALLY FOR AREAS OF ECZEMA UP TO TWICE DAILY AS NEEDED ONLY  Ashley Snowball, MSN, CNM 09/07/2023 3:58 PM  Certified Nurse Midwife, [facility]

## 2023-09-07 NOTE — MAU Note (Signed)
Ashley Weber is a 28 y.o. at Unknown here in MAU reporting: she began having sharp abdominal pain in the middle of her abdomen last night along with diarrhea.  Reports today she now has N/V with abdominal pain and diarrhea.  Reports has had approx both 5 diarrhea stools and vomiting episodes.   Denies VB.  Reports +HPT and + UPT @ [facility]. RN shown letter of verification from the [facility] LMP: 06/08/2023 Onset of complaint: last night Pain score: 10 Vitals:   09/07/23 1124  BP: 138/79  Pulse: 93  Resp: 20  Temp: (!) 97.5 F (36.4 C)  SpO2: 100%     FHT: 154. bpm  Lab orders placed from triage: UA

## 2023-09-10 ENCOUNTER — Ambulatory Visit: Payer: BC Managed Care – PPO

## 2023-09-24 ENCOUNTER — Encounter: Payer: Self-pay | Admitting: *Deleted

## 2023-09-29 ENCOUNTER — Telehealth: Payer: BC Managed Care – PPO

## 2023-09-29 DIAGNOSIS — Z3482 Encounter for supervision of other normal pregnancy, second trimester: Secondary | ICD-10-CM

## 2023-09-29 DIAGNOSIS — Z3480 Encounter for supervision of other normal pregnancy, unspecified trimester: Secondary | ICD-10-CM | POA: Insufficient documentation

## 2023-09-29 DIAGNOSIS — Z3A16 16 weeks gestation of pregnancy: Secondary | ICD-10-CM

## 2023-09-29 NOTE — Progress Notes (Signed)
New OB Intake  I connected with Ashley Weber  on 09/29/23 at  3:15 PM EST by MyChart Video Visit and verified that I am speaking with the correct person using two identifiers. Nurse is located at [facility] and pt is located at home.  I discussed the limitations, risks, security and privacy concerns of performing an evaluation and management service by telephone and the availability of in person appointments. I also discussed with the patient that there may be a patient responsible charge related to this service. The patient expressed understanding and agreed to proceed.  I explained I am completing New OB Intake today. We discussed EDD of 03/14/2024, by Last Menstrual Period. Pt is G1P0. I reviewed her allergies, medications and Medical/Surgical/OB history.    Patient Active Problem List   Diagnosis Date Noted   Supervision of other normal pregnancy, antepartum 09/29/2023    Concerns addressed today  Delivery Plans Plans to deliver at [facility]. Discussed the nature of our practice with multiple providers including residents and students. Due to the size of the practice, the delivering provider may not be the same as those providing prenatal care.   Patient is not interested in water birth. Offered upcoming OB visit with CNM to discuss further.  MyChart/Babyscripts MyChart access verified. I explained pt will have some visits in office and some virtually. Babyscripts instructions given and order placed. Patient verifies receipt of registration text/e-mail. Account successfully created and app downloaded. If patient is a candidate for Optimized scheduling, add to sticky note.   Blood Pressure Cuff/Weight Scale Pt has own BP Cuff, Explained after first prenatal appt pt will check weekly and document in Babyscripts. Patient does have weight scale.  Anatomy US Explained first scheduled [place] will be around 19 weeks. Anatomy US scheduled for 10/22/23 at 100p.  Is patient a CenteringPregnancy  candidate?  Accepted   If accepted,    Is patient a Mom+Baby Combined Care candidate?  Not a candidate   If accepted, confirm patient does not intend to move from the area for at least 12 months, then notify Mom+Baby staff  Interested in [place]? If yes, send referral and doula dot phrase.   Is patient a candidate for Babyscripts Optimization? No, due to Centering   First visit review I reviewed new OB appt with patient. Explained pt will be seen by Lamont Snowball at first visit. Discussed Avelina Laine genetic screening with patient. Needs Panorama and Horizon.. Routine prenatal labs  needed at [facility] OB visit.    Last Pap Needs Pap  Ashley Weber, [facility] 09/29/2023  3:41 PM

## 2023-10-01 ENCOUNTER — Encounter: Payer: Self-pay | Admitting: *Deleted

## 2023-10-05 NOTE — Progress Notes (Unsigned)
   PRENATAL VISIT NOTE  Subjective:  Ashley Weber is a 28 y.o. G1P0 at [redacted]w[redacted]d being seen today for ongoing prenatal care.  She is currently monitored for the following issues for this {Blank single:19197::"high-risk","low-risk"} pregnancy and has Supervision of other normal pregnancy, antepartum on their problem list.  Patient reports {sx:14538}.   .  .   . Denies leaking of fluid.   The following portions of the patient's history were reviewed and updated as appropriate: allergies, current medications, past family history, past medical history, past social history, past surgical history and problem list.   Objective:  There were no vitals filed for this visit.  Fetal Status:           General:  Alert, oriented and cooperative. Patient is in no acute distress.  Skin: Skin is warm and dry. No rash noted.   Cardiovascular: Normal heart rate noted  Respiratory: Normal respiratory effort, no problems with respiration noted  Abdomen: Soft, gravid, appropriate for gestational age.        Pelvic: {Blank single:19197::"Cervical exam performed in the presence of a chaperone","Cervical exam deferred"}        Extremities: Normal range of motion.     Mental Status: Normal mood and affect. Normal behavior. Normal judgment and thought content.   Assessment and Plan:  Pregnancy: G1P0 at [redacted]w[redacted]d 1. Supervision of other normal pregnancy, antepartum (Primary) ***  2. [redacted] weeks gestation of pregnancy ***  {Blank single:19197::"Term","Preterm"} labor symptoms and general obstetric precautions including but not limited to vaginal bleeding, contractions, leaking of fluid and fetal movement were reviewed in detail with the patient. Please refer to After Visit Summary for other counseling recommendations.   No follow-ups on file.  Future Appointments  Date Time Provider Department Center  10/06/2023  8:55 AM [facility]  10/22/2023  1:00 PM [facility] NURSE [facility]  10/22/2023   1:15 PM [facility] PROVIDER 1 [facility]  10/22/2023  1:30 PM [facility] US3 [facility]  10/28/2023  9:00 AM CENTERING PROVIDER [facility]  11/25/2023  9:00 AM CENTERING PROVIDER [facility]  12/23/2023  9:00 AM CENTERING PROVIDER [facility]  01/06/2024  9:00 AM CENTERING PROVIDER [facility]  01/20/2024  9:00 AM CENTERING PROVIDER [facility]  02/03/2024  9:00 AM CENTERING PROVIDER [facility]  02/17/2024  9:00 AM CENTERING PROVIDER [facility]  03/02/2024  9:00 AM CENTERING PROVIDER [facility]  03/16/2024  9:00 AM CENTERING PROVIDER [facility]  03/30/2024  9:00 AM CENTERING PROVIDER [facility]    Richardson Landry, CNM

## 2023-10-06 ENCOUNTER — Other Ambulatory Visit: Payer: Self-pay

## 2023-10-06 ENCOUNTER — Other Ambulatory Visit (HOSPITAL_COMMUNITY)
Admission: RE | Admit: 2023-10-06 | Discharge: 2023-10-06 | Disposition: A | Discharge status: Home / Self Care | Payer: BC Managed Care – PPO | Source: Ambulatory Visit | Attending: Certified Nurse Midwife | Admitting: Certified Nurse Midwife

## 2023-10-06 ENCOUNTER — Ambulatory Visit (INDEPENDENT_AMBULATORY_CARE_PROVIDER_SITE_OTHER): Payer: BC Managed Care – PPO | Admitting: Certified Nurse Midwife

## 2023-10-06 ENCOUNTER — Encounter: Payer: Self-pay | Admitting: Certified Nurse Midwife

## 2023-10-06 VITALS — BP 115/72 | HR 80 | Wt 239.3 lb

## 2023-10-06 DIAGNOSIS — N898 Other specified noninflammatory disorders of vagina: Secondary | ICD-10-CM

## 2023-10-06 DIAGNOSIS — Z3480 Encounter for supervision of other normal pregnancy, unspecified trimester: Secondary | ICD-10-CM

## 2023-10-06 DIAGNOSIS — N760 Acute vaginitis: Secondary | ICD-10-CM

## 2023-10-06 DIAGNOSIS — B9689 Other specified bacterial agents as the cause of diseases classified elsewhere: Secondary | ICD-10-CM | POA: Diagnosis not present

## 2023-10-06 DIAGNOSIS — Z124 Encounter for screening for malignant neoplasm of cervix: Secondary | ICD-10-CM | POA: Diagnosis not present

## 2023-10-06 DIAGNOSIS — Z1332 Encounter for screening for maternal depression: Secondary | ICD-10-CM

## 2023-10-06 DIAGNOSIS — Z3482 Encounter for supervision of other normal pregnancy, second trimester: Secondary | ICD-10-CM | POA: Diagnosis not present

## 2023-10-06 DIAGNOSIS — Z3A17 17 weeks gestation of pregnancy: Secondary | ICD-10-CM

## 2023-10-06 DIAGNOSIS — Z1151 Encounter for screening for human papillomavirus (HPV): Secondary | ICD-10-CM | POA: Diagnosis not present

## 2023-10-06 NOTE — Addendum Note (Signed)
Addended by: Marjo Bicker on: 10/06/2023 12:47 PM   Modules accepted: Orders

## 2023-10-07 ENCOUNTER — Telehealth: Payer: Self-pay | Admitting: *Deleted

## 2023-10-07 LAB — CBC/D/PLT+RPR+RH+ABO+RUBIGG...
Antibody Screen: NEGATIVE
Basophils Absolute: 0 10*3/uL (ref 0.0–0.2)
Basos: 0 %
EOS (ABSOLUTE): 0.1 10*3/uL (ref 0.0–0.4)
Eos: 2 %
HCV Ab: NONREACTIVE
HIV Screen 4th Generation wRfx: NONREACTIVE
Hematocrit: 36.3 % (ref 34.0–46.6)
Hemoglobin: 11.9 g/dL (ref 11.1–15.9)
Hepatitis B Surface Ag: NEGATIVE
Immature Grans (Abs): 0 10*3/uL (ref 0.0–0.1)
Immature Granulocytes: 0 %
Lymphocytes Absolute: 1.5 10*3/uL (ref 0.7–3.1)
Lymphs: 27 %
MCH: 28 pg (ref 26.6–33.0)
MCHC: 32.8 g/dL (ref 31.5–35.7)
MCV: 85 fL (ref 79–97)
Monocytes Absolute: 0.3 10*3/uL (ref 0.1–0.9)
Monocytes: 6 %
Neutrophils Absolute: 3.6 10*3/uL (ref 1.4–7.0)
Neutrophils: 65 %
Platelets: 259 10*3/uL (ref 150–450)
RBC: 4.25 x10E6/uL (ref 3.77–5.28)
RDW: 13.8 % (ref 11.7–15.4)
RPR Ser Ql: NONREACTIVE
Rh Factor: NEGATIVE
Rubella Antibodies, IGG: 6.04 {index} (ref >0.99)
WBC: 5.6 10*3/uL (ref 3.4–10.8)

## 2023-10-07 LAB — URINE CULTURE, OB REFLEX

## 2023-10-07 LAB — AFP, SERUM, OPEN SPINA BIFIDA
AFP MoM: 0.65
AFP Value: 20.6 ng/mL
Gest. Age on Collection Date: 17.1 wk
Maternal Age At EDD: 25.7 a
OSBR Risk 1 IN: 10000
Test Results:: NEGATIVE
Weight: 239 [lb_av]

## 2023-10-07 LAB — HCV INTERPRETATION

## 2023-10-07 LAB — HEMOGLOBIN A1C
Est. average glucose Bld gHb Est-mCnc: 120 mg/dL
Hgb A1c MFr Bld: 5.8 % — ABNORMAL HIGH (ref 4.8–5.6)

## 2023-10-07 LAB — CULTURE, OB URINE

## 2023-10-07 NOTE — Telephone Encounter (Signed)
I received a message Ashley Weber no longer wants to do centering. I called to discuss and she confirmed she wants to do one on one appointments because this is her and her partners first baby. I explained she will have a few minutes with provider during centering during assessments. I offered for her to try first 2 sessions. She states she wants to do one on one only. I explained I will cancel her centering appointments. She does have a ob follow up scheduled. Nancy Fetter

## 2023-10-08 ENCOUNTER — Encounter: Payer: Self-pay | Admitting: Certified Nurse Midwife

## 2023-10-08 ENCOUNTER — Other Ambulatory Visit: Payer: Self-pay | Admitting: Certified Nurse Midwife

## 2023-10-08 DIAGNOSIS — O99810 Abnormal glucose complicating pregnancy: Secondary | ICD-10-CM

## 2023-10-08 LAB — CERVICOVAGINAL ANCILLARY ONLY
Bacterial Vaginitis (gardnerella): POSITIVE — AB
Candida Glabrata: NEGATIVE
Candida Vaginitis: NEGATIVE
Comment: NEGATIVE
Comment: NEGATIVE
Comment: NEGATIVE
Comment: NEGATIVE
Trichomonas: NEGATIVE

## 2023-10-08 MED ORDER — METRONIDAZOLE 500 MG PO TABS: 500.0000 mg | ORAL_TABLET | Freq: Two times a day (BID) | ORAL | 0 refills | Status: DC

## 2023-10-08 NOTE — Addendum Note (Signed)
Addended by: Lamont Snowball A on: 10/08/2023 08:52 PM   Modules accepted: Orders

## 2023-10-09 LAB — CYTOLOGY - PAP
Chlamydia: NEGATIVE
Comment: NEGATIVE
Comment: NEGATIVE
Comment: NORMAL
Diagnosis: NEGATIVE
High risk HPV: NEGATIVE
Neisseria Gonorrhea: NEGATIVE

## 2023-10-14 ENCOUNTER — Other Ambulatory Visit: Payer: BC Managed Care – PPO

## 2023-10-14 ENCOUNTER — Other Ambulatory Visit: Payer: Self-pay

## 2023-10-14 DIAGNOSIS — O99810 Abnormal glucose complicating pregnancy: Secondary | ICD-10-CM

## 2023-10-15 ENCOUNTER — Encounter: Payer: Self-pay | Admitting: Certified Nurse Midwife

## 2023-10-15 LAB — PANORAMA PRENATAL TEST FULL PANEL:PANORAMA TEST PLUS 5 ADDITIONAL MICRODELETIONS: FETAL FRACTION: 4.5

## 2023-10-15 LAB — GLUCOSE TOLERANCE, 2 HOURS W/ 1HR
Glucose, 1 hour: 140 mg/dL (ref 70–179)
Glucose, 2 hour: 90 mg/dL (ref 70–152)
Glucose, Fasting: 91 mg/dL (ref 70–91)

## 2023-10-19 ENCOUNTER — Encounter: Payer: Self-pay | Admitting: *Deleted

## 2023-10-19 ENCOUNTER — Encounter: Payer: Self-pay | Admitting: Certified Nurse Midwife

## 2023-10-19 LAB — HORIZON CUSTOM: REPORT SUMMARY: POSITIVE — AB

## 2023-10-22 ENCOUNTER — Other Ambulatory Visit: Payer: Self-pay

## 2023-10-22 ENCOUNTER — Ambulatory Visit: Payer: BC Managed Care – PPO

## 2023-10-22 ENCOUNTER — Ambulatory Visit: Payer: BC Managed Care – PPO | Admitting: *Deleted

## 2023-10-22 ENCOUNTER — Ambulatory Visit (HOSPITAL_BASED_OUTPATIENT_CLINIC_OR_DEPARTMENT_OTHER)

## 2023-10-22 ENCOUNTER — Ambulatory Visit: Payer: BC Managed Care – PPO | Attending: Certified Nurse Midwife

## 2023-10-22 ENCOUNTER — Other Ambulatory Visit: Payer: Self-pay | Admitting: *Deleted

## 2023-10-22 VITALS — BP 119/65 | HR 74

## 2023-10-22 DIAGNOSIS — Z148 Genetic carrier of other disease: Secondary | ICD-10-CM | POA: Diagnosis not present

## 2023-10-22 DIAGNOSIS — Z363 Encounter for antenatal screening for malformations: Secondary | ICD-10-CM | POA: Insufficient documentation

## 2023-10-22 DIAGNOSIS — D563 Thalassemia minor: Secondary | ICD-10-CM

## 2023-10-22 DIAGNOSIS — O99212 Obesity complicating pregnancy, second trimester: Secondary | ICD-10-CM

## 2023-10-22 DIAGNOSIS — O280 Abnormal hematological finding on antenatal screening of mother: Secondary | ICD-10-CM | POA: Insufficient documentation

## 2023-10-22 DIAGNOSIS — Z3480 Encounter for supervision of other normal pregnancy, unspecified trimester: Secondary | ICD-10-CM | POA: Diagnosis not present

## 2023-10-22 DIAGNOSIS — Z3A19 19 weeks gestation of pregnancy: Secondary | ICD-10-CM

## 2023-10-22 DIAGNOSIS — O99012 Anemia complicating pregnancy, second trimester: Secondary | ICD-10-CM

## 2023-10-22 DIAGNOSIS — Z362 Encounter for other antenatal screening follow-up: Secondary | ICD-10-CM

## 2023-10-22 DIAGNOSIS — O99810 Abnormal glucose complicating pregnancy: Secondary | ICD-10-CM | POA: Insufficient documentation

## 2023-10-22 NOTE — Progress Notes (Signed)
 [facility] at [facility] [address], [address] Phone:  [phone]   Fax:  [phone]      In-Person Genetic Counseling Clinic Note:   I spoke with 28 y.oCarnella Guadalajara Weber today to discuss her carrier screening results. She was referred by Serita Grit, PA-C. She was accompanied by FOB Davontae.   Pregnancy History:    G1P0. EGA: [redacted]w[redacted]d by LMP. EDD: 03/14/2024. Denies personal health concerns.  Denies bleeding, infections, and fevers in this pregnancy. Denies using tobacco, alcohol, or street drugs in this pregnancy.   Family History:    A three-generation pedigree was created and scanned into Epic under the Media tab.  FOB reports that his second Rasp is 26 yo and was born with bilateral postaxial polydactyly and heterotaxy. He reports that this Deveney needed surgery after birth and is doing well now. We discussed that postaxial polydactyly is an autosomal dominant finding with reduced penetrance and is especially common in Black populations. We also reviewed that certain genetic syndromes can be associated with heterotaxy. Without medical or genetic reports, recurrence risk is difficult to estimate. We reviewed that the anatomy ultrasound while not perfect can detect many birth defects.  If the couple learns of additional information regarding their family histories, we are happy to revisit and reassess risk to the pregnancy.  Maternal ethnicity reported as Black and paternal ethnicity reported as Black. Denies Ashkenazi Jewish ancestry.  Family history not remarkable for consanguinity, intellectual disability, autism spectrum disorder, multiple spontaneous abortions, still births, or unexplained neonatal death.   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 She screened negative for the other three  conditions (CF, SMA, and beta-hemoglobinopathies). Please see report for details.  We reviewed the genetics of alpha thalassemia, autosomal recessive mode of inheritance, and clinical features of these conditions. We reviewed that [name] will either pass down two copies of the alpha globin gene (??) OR one copy of the alpha globin gene (-?) in each pregnancy. Therefore, this pregnancy is not at increased risk for hemoglobin Bart's due to four deletions of the alpha globin genes (--/--) regardless of her reproductive partner's carrier status. The pregnancy will be at increased risk (25%) for hemoglobin H disease if her partner is an alpha thalassemia carrier in the cis configuration (--/??). We reviewed that this is more common in [place] Asian populations and less common in those with Black ancestry.  Given these results, we discussed and offered carrier screening for Ame's reproductive partner. We reviewed the benefits and limitations of carrier screening and that it can detect most but not all carriers.  The couple consented carrier screening for FOB. His blood was drawn during today's visit. He consented that we call [name] with the results. We reviewed that if both were found to be carriers, prenatal diagnosis through amniocentesis would be available. We reviewed the technical aspects, benefits, risks, and limitations of  including the 1 in 500 risk for miscarriage. Alternatively, testing can be completed postnatally. Of note, alpha thalassemia is not included on [place]'s Newborn Screening (NBS) program.   Newborn Screening. The [place] Newborn Screening (NBS) program will screen all newborn babies for cystic fibrosis, spinal muscular atrophy, hemoglobinopathies, and numerous other conditions.  Previous Testing Completed:  Low risk NIPS: Jaylei previously completed noninvasive prenatal screening (NIPS) in this pregnancy.  The result is low risk, consistent with a female fetus. This  screening significantly reduces but does not eliminate the chance that the current pregnancy has Down syndrome (trisomy 67), trisomy 33, trisomy 7, common sex chromosome conditions, and 22q11.2 microdeletion syndrome. Please see report for details. There are many genetic conditions that cannot be detected by NIPS.   Negative ms-AFP screening: Karolyna previously completed a maternal serum AFP screen in this pregnancy. The result is screen negative. Please see report for details. A negative result reduces the risk that the current pregnancy has an open neural tube defect. Closed neural tube defects and some open defects may not be detected by this screen.   Plan of Care:   Alpha thalassemia carrier screening for FOB was drawn today. He consented we call [facility] with the results. Routine prenatal care.   Informed consent was obtained. All questions were answered.   45 minutes were spent on the date of the encounter in service to the patient including preparation, face-to-face consultation, discussion of test reports and available next steps, pedigree construction, genetic risk assessment, documentation, and care coordination.    Thank you for sharing in the care of Ashley Weber with [place].  Please do not hesitate to contact us at [phone] if you have any questions.   Sheppard Plumber, MS, [place] Certified Genetic Counselor   Genetic counseling student involved in appointment: No.

## 2023-10-22 NOTE — Progress Notes (Signed)
 MFM Consult Note  Ashley Weber is currently at 19 weeks and 3 days.  She was seen due to maternal obesity with a BMI of 37.  She denies any problems in her current pregnancy.    She had a cell free DNA test earlier in her pregnancy which indicated a low risk for trisomy 49, 63, and 13. A female fetus is predicted.   Her Horizon screening test indicated that she is a silent carrier for alpha thalassemia.  She was informed that the fetal growth and amniotic fluid level were appropriate for her gestational age.   There were no obvious fetal anomalies noted on today's ultrasound exam.  However, today's exam was limited due to the fetal position.  The patient was informed that anomalies may be missed due to technical limitations. If the fetus is in a suboptimal position or maternal habitus is increased, visualization of the fetus in the maternal uterus may be impaired.  The following were discussed during today's consultation:  Silent carrier for alpha thalassemia  The patient was advised that alpha thalassemia is inherited in an autosomal recessive fashion.   Her partner had his blood drawn today to determine if he is also a carrier for alpha thalassemia.  Our genetic counselor will notify him regarding the results of this test.  Should he screen negative for alpha thalassemia, their child should not be at risk for inheriting this disease.  Should both parents be carriers for this condition, their child may have a 25% chance of inheriting the disease.    Should both parents be identified as carriers for this condition, she understands that an amniocentesis is available for definitive prenatal diagnosis.  A follow-up exam was scheduled in 4 weeks to assess the fetal growth and to complete the views of the fetal anatomy.    The patient and her partner stated that all of their questions were answered today.  A total of 30 minutes was spent counseling and coordinating the care for this  patient.  Greater than 50% of the time was spent in direct face-to-face contact.

## 2023-10-29 ENCOUNTER — Ambulatory Visit: Payer: Self-pay

## 2023-11-02 NOTE — Progress Notes (Unsigned)
   PRENATAL VISIT NOTE  Subjective:  Ashley Weber is a 28 y.o. G1P0000 at [redacted]w[redacted]d being seen today for ongoing prenatal care.  She is currently monitored for the following issues for this {Blank single:19197::"high-risk","low-risk"} pregnancy and has Supervision of other normal pregnancy, antepartum on their problem list.  Patient reports {sx:14538}.   .  .   . Denies leaking of fluid.   The following portions of the patient's history were reviewed and updated as appropriate: allergies, current medications, past family history, past medical history, past social history, past surgical history and problem list.   Objective:  There were no vitals filed for this visit.  Fetal Status:           General:  Alert, oriented and cooperative. Patient is in no acute distress.  Skin: Skin is warm and dry. No rash noted.   Cardiovascular: Normal heart rate noted  Respiratory: Normal respiratory effort, no problems with respiration noted  Abdomen: Soft, gravid, appropriate for gestational age.        Pelvic: {Blank single:19197::"Cervical exam performed in the presence of a chaperone","Cervical exam deferred"}        Extremities: Normal range of motion.     Mental Status: Normal mood and affect. Normal behavior. Normal judgment and thought content.   Assessment and Plan:  Pregnancy: G1P0000 at [redacted]w[redacted]d 1. Supervision of other normal pregnancy, antepartum (Primary) ***  2. [redacted] weeks gestation of pregnancy ***  3. 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 Please refer to After Visit Summary for other counseling recommendations.   No follow-ups on file.  Future Appointments  Date Time Provider Department Center  11/03/2023  9:35 AM [facility] Half [facility]   11/19/2023  7:30 AM [facility] US5 [facility]    Richardson Landry, CNM

## 2023-11-03 ENCOUNTER — Other Ambulatory Visit: Payer: Self-pay

## 2023-11-03 ENCOUNTER — Ambulatory Visit (INDEPENDENT_AMBULATORY_CARE_PROVIDER_SITE_OTHER): Payer: BC Managed Care – PPO | Admitting: Certified Nurse Midwife

## 2023-11-03 VITALS — BP 135/70 | HR 76 | Wt 246.6 lb

## 2023-11-03 DIAGNOSIS — Z3482 Encounter for supervision of other normal pregnancy, second trimester: Secondary | ICD-10-CM | POA: Diagnosis not present

## 2023-11-03 DIAGNOSIS — O99810 Abnormal glucose complicating pregnancy: Secondary | ICD-10-CM

## 2023-11-03 DIAGNOSIS — Z3480 Encounter for supervision of other normal pregnancy, unspecified trimester: Secondary | ICD-10-CM

## 2023-11-03 DIAGNOSIS — Z3A21 21 weeks gestation of pregnancy: Secondary | ICD-10-CM

## 2023-11-03 NOTE — Patient Instructions (Signed)
 Ashley Weber to see you today! I'm adding in some information for you on

## 2023-11-09 ENCOUNTER — Telehealth: Payer: Self-pay

## 2023-11-09 NOTE — Telephone Encounter (Signed)
 Patient called back. FOB (Davontae Hall DOB 08/30/1998) was also present on the call. 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  We reviewed that since Johnnisha is a silent carrier due to the 3.7 deletion (-a/aa), there would be a 50% chance that this couple's current and any future pregnancies would be a carrier for alpha thalassemia like Davontae (-a/-a) and a 50% chance to be a silent carrier for alpha thalassemia like Khadijatou (-a/aa). We discussed that carriers (-a/-a) may have mild anemia but are not affected with alpha thalassemia. Davontae can inform his PCP so they can follow-up appropriately.  Sheppard Plumber, MS, [facility] Certified Genetic Counselor [facility] [phone]

## 2023-11-09 NOTE — Telephone Encounter (Signed)
 I left a message asking the patient to call back to discuss test results.

## 2023-11-11 ENCOUNTER — Encounter (HOSPITAL_COMMUNITY): Payer: Self-pay | Admitting: Obstetrics & Gynecology

## 2023-11-11 ENCOUNTER — Encounter: Payer: Self-pay | Admitting: Certified Nurse Midwife

## 2023-11-11 ENCOUNTER — Inpatient Hospital Stay (HOSPITAL_COMMUNITY)
Admission: AD | Admit: 2023-11-11 | Discharge: 2023-11-11 | Disposition: A | Discharge status: Home / Self Care | Attending: Obstetrics & Gynecology | Admitting: Obstetrics & Gynecology

## 2023-11-11 DIAGNOSIS — Z3A22 22 weeks gestation of pregnancy: Secondary | ICD-10-CM | POA: Insufficient documentation

## 2023-11-11 DIAGNOSIS — R197 Diarrhea, unspecified: Secondary | ICD-10-CM | POA: Diagnosis present

## 2023-11-11 DIAGNOSIS — O212 Late vomiting of pregnancy: Secondary | ICD-10-CM | POA: Insufficient documentation

## 2023-11-11 DIAGNOSIS — R519 Headache, unspecified: Secondary | ICD-10-CM | POA: Insufficient documentation

## 2023-11-11 DIAGNOSIS — O99891 Other specified diseases and conditions complicating pregnancy: Secondary | ICD-10-CM | POA: Diagnosis present

## 2023-11-11 DIAGNOSIS — R112 Nausea with vomiting, unspecified: Secondary | ICD-10-CM

## 2023-11-11 DIAGNOSIS — R1030 Lower abdominal pain, unspecified: Secondary | ICD-10-CM | POA: Diagnosis not present

## 2023-11-11 LAB — CBC
HCT: 33.9 % — ABNORMAL LOW (ref 36.0–46.0)
Hemoglobin: 11.1 g/dL — ABNORMAL LOW (ref 12.0–15.0)
MCH: 28.3 pg (ref 26.0–34.0)
MCHC: 32.7 g/dL (ref 30.0–36.0)
MCV: 86.5 fL (ref 80.0–100.0)
Platelets: 256 10*3/uL (ref 150–400)
RBC: 3.92 MIL/uL (ref 3.87–5.11)
RDW: 13.4 % (ref 11.5–15.5)
WBC: 5.9 10*3/uL (ref 4.0–10.5)
nRBC: 0 % (ref 0.0–0.2)

## 2023-11-11 LAB — URINALYSIS, ROUTINE W REFLEX MICROSCOPIC
Bacteria, UA: NONE SEEN
Bilirubin Urine: NEGATIVE
Glucose, UA: NEGATIVE mg/dL
Hgb urine dipstick: NEGATIVE
Ketones, ur: NEGATIVE mg/dL
Leukocytes,Ua: NEGATIVE
Nitrite: NEGATIVE
Protein, ur: 30 mg/dL — AB
Specific Gravity, Urine: 1.028 (ref 1.005–1.030)
pH: 5 (ref 5.0–8.0)

## 2023-11-11 LAB — COMPREHENSIVE METABOLIC PANEL
ALT: 17 U/L (ref 0–44)
AST: 22 U/L (ref 15–41)
Albumin: 3.1 g/dL — ABNORMAL LOW (ref 3.5–5.0)
Alkaline Phosphatase: 49 U/L (ref 38–126)
Anion gap: 7 (ref 5–15)
BUN: 14 mg/dL (ref 6–20)
CO2: 22 mmol/L (ref 22–32)
Calcium: 9 mg/dL (ref 8.9–10.3)
Chloride: 106 mmol/L (ref 98–111)
Creatinine, Ser: 0.83 mg/dL (ref 0.44–1.00)
GFR, Estimated: >60 mL/min (ref >60)
Glucose, Bld: 95 mg/dL (ref 70–99)
Potassium: 3.9 mmol/L (ref 3.5–5.1)
Sodium: 135 mmol/L (ref 135–145)
Total Bilirubin: 0.6 mg/dL (ref 0.0–1.2)
Total Protein: 7 g/dL (ref 6.5–8.1)

## 2023-11-11 MED ORDER — ONDANSETRON HCL 4 MG/2ML IJ SOLN
4.0000 mg | Freq: Once | INTRAMUSCULAR | Status: AC
  Administered 2023-11-11: 4 mg via INTRAVENOUS
  Filled 2023-11-11: qty 2

## 2023-11-11 MED ORDER — ONDANSETRON HCL 4 MG PO TABS: 4.0000 mg | ORAL_TABLET | Freq: Three times a day (TID) | ORAL | 0 refills | Status: DC | PRN

## 2023-11-11 MED ORDER — LACTATED RINGERS IV BOLUS
1000.0000 mL | Freq: Once | INTRAVENOUS | Status: AC
  Administered 2023-11-11: 1000 mL via INTRAVENOUS

## 2023-11-11 NOTE — MAU Note (Signed)
.  Ashley Weber is a 28 y.o. at [redacted]w[redacted]d here in MAU reporting: started feeling nauseated last night and stomach pain. Vomited x2 this morning and had diarrhea. Her Boyfriend sis sick with same symptoms as well.   LMP:  Onset of complaint: last night Pain score: 5-6 There were no vitals filed for this visit.   FHT: 147   Lab orders placed from triage: u/a

## 2023-11-11 NOTE — MAU Provider Note (Signed)
 Chief Complaint:  No chief complaint on file.   HPI   None     Ashley Weber is a 28 y.o. G1P0000 at [redacted]w[redacted]d who presents to maternity admissions reporting nausea and vomiting. She reports last night she developed 8/10 lower abdominal pain, diarrhea occurring a few times overnight, and close contact with similar illness. Her boyfriend developed similar abdominal pain and vomited once last night but has since recovered fully. She initially developed a headache, then nausea and diarrhea. Symptoms have been present for approximately  15  hours. The symptoms are gradually improving, the headache has resolved at this point. The patient endorses intermittent 5/10 suprapubic abdominal pain this morning, 5 episodes of vomiting this morning, and diarrhea. She denies flank pain, dysphagia, constipation, chest pain, shortness of breath, hematemesis, melena, hematochezia, fever, chills, recent antibiotics, and recent travel. At home, she has not tried any medication. She is not able to keep any food or fluid down. She does not have a history of hyperemesis gravidarum. Denies fatigue, weight changes, heat/cold intolerance, skin/hair changes, CVS symptoms. Denies vaginal bleeding, vaginal discharge, leaking of fluid. Endorses good fetal movement.  Pregnancy Course: Receives care at [facility]. Prenatal records reviewed. Monitored for low-risk pregnancy.  Past Medical History:  Diagnosis Date   Asthma    OB History  Gravida Para Term Preterm AB Living  1 0 0 0 0 0  SAB IAB Ectopic Multiple Live Births  0 0 0 0 0    # Outcome Date GA Lbr Len/2nd Weight Sex Type Anes PTL Lv  1 Current            Past Surgical History:  Procedure Laterality Date   NO PAST SURGERIES     No family history on file. Social History   Tobacco Use   Smoking status: Never   Smokeless tobacco: Never  Vaping Use   Vaping status: Former  Substance Use Topics   Alcohol use: Not Currently   Drug use: Not Currently   Allergies   Allergen Reactions   Peanut-Containing Drug Products Hives and Swelling   Medications Prior to Admission  Medication Sig Dispense Refill Last Dose/Taking   folic acid (FOLVITE) 1 MG tablet Take 1 mg by mouth daily.   11/10/2023   Prenatal Vit-Fe Fumarate-FA (PRENATAL MULTIVITAMIN) TABS tablet Take 1 tablet by mouth daily at 12 noon.   11/10/2023    I have reviewed patient's Past Medical Hx, Surgical Hx, Family Hx, Social Hx, medications and allergies.   ROS  Pertinent items noted in HPI and remainder of comprehensive ROS otherwise negative.   PHYSICAL EXAM  Patient Vitals for the past 24 hrs:  BP Temp Pulse Resp Height Weight  11/11/23 1048 136/73 98.2 F (36.8 C) 90 18 5\' 8"  (1.727 m) 109.8 kg    Constitutional: Well-developed, well-nourished female in no acute distress. Non-toxic appearing. Cardiovascular: normal rate & rhythm, warm and well-perfused Respiratory: normal effort, no problems with respiration noted GI: Abd soft, non-tender, non-distended MSK: Extremities nontender, no edema, normal ROM Skin: warm and dry. Acyanotic, no jaundice or pallor. Neurologic: Alert and oriented x 4. No abnormal coordination. Psychiatric: Normal mood. Speech not slurred, not rapid/pressured. Patient is cooperative. GU: no CVA tenderness     Labs: Results for orders placed or performed during the hospital encounter of 11/11/23 (from the past 24 hours)  Urinalysis, Routine w reflex microscopic -Urine, Clean Catch     Status: Abnormal   Collection Time: 11/11/23 10:49 AM  Result Value Ref Range  Color, Urine YELLOW YELLOW   APPearance CLEAR CLEAR   Specific Gravity, Urine 1.028 1.005 - 1.030   pH 5.0 5.0 - 8.0   Glucose, UA NEGATIVE NEGATIVE mg/dL   Hgb urine dipstick NEGATIVE NEGATIVE   Bilirubin Urine NEGATIVE NEGATIVE   Ketones, ur NEGATIVE NEGATIVE mg/dL   Protein, ur 30 (A) NEGATIVE mg/dL   Nitrite NEGATIVE NEGATIVE   Leukocytes,Ua NEGATIVE NEGATIVE   RBC / HPF 0-5 0 - 5  RBC/hpf   WBC, UA 0-5 0 - 5 WBC/hpf   Bacteria, UA NONE SEEN NONE SEEN   Squamous Epithelial / HPF 0-5 0 - 5 /HPF   Mucus PRESENT   Comprehensive metabolic panel     Status: Abnormal   Collection Time: 11/11/23 11:46 AM  Result Value Ref Range   Sodium 135 135 - 145 mmol/L   Potassium 3.9 3.5 - 5.1 mmol/L   Chloride 106 98 - 111 mmol/L   CO2 22 22 - 32 mmol/L   Glucose, Bld 95 70 - 99 mg/dL   BUN 14 6 - 20 mg/dL   Creatinine, Ser 1.61 0.44 - 1.00 mg/dL   Calcium 9.0 8.9 - 09.6 mg/dL   Total Protein 7.0 6.5 - 8.1 g/dL   Albumin 3.1 (L) 3.5 - 5.0 g/dL   AST 22 15 - 41 U/L   ALT 17 0 - 44 U/L   Alkaline Phosphatase 49 38 - 126 U/L   Total Bilirubin 0.6 0.0 - 1.2 mg/dL   GFR, Estimated >04 >54 mL/min   Anion gap 7 5 - 15  CBC     Status: Abnormal   Collection Time: 11/11/23 11:46 AM  Result Value Ref Range   WBC 5.9 4.0 - 10.5 K/uL   RBC 3.92 3.87 - 5.11 MIL/uL   Hemoglobin 11.1 (L) 12.0 - 15.0 g/dL   HCT 09.8 (L) 11.9 - 14.7 %   MCV 86.5 80.0 - 100.0 fL   MCH 28.3 26.0 - 34.0 pg   MCHC 32.7 30.0 - 36.0 g/dL   RDW 82.9 56.2 - 13.0 %   Platelets 256 150 - 400 K/uL   nRBC 0.0 0.0 - 0.2 %    Imaging:  No results found.  MDM & MAU COURSE  MDM: Moderate  MAU Course: -Afebrile, normotensive, normal HR reassuring. Physical exam reassuring, abdomen nontender. -CMP and CBC for signs of dehydration/severe infection. UA to rule out UTI. -IV fluids to rehydrate, IV Zofran for nausea as she cannot tolerate PO meds. -UA negative for infection, CBC wnl other than mild anemia. CMP wnl. -Patient feeling much better after Zofran and IV fluids.  Differential diagnosis considered for nausea/vomiting includes but is not limited to: viral infection, gastroenteritis, food poisoning, nausea due to pregnancy, hyperemesis gravidarum, cholecystitis,  gastroparesis  Orders Placed This Encounter  Procedures   Urinalysis, Routine w reflex microscopic -Urine, Clean Catch   Comprehensive  metabolic panel   CBC   Discharge patient   Meds ordered this encounter  Medications   lactated ringers bolus 1,000 mL   ondansetron (ZOFRAN) injection 4 mg   ondansetron (ZOFRAN) 4 MG tablet    Sig: Take 1 tablet (4 mg total) by mouth every 8 (eight) hours as needed for nausea or vomiting.    Dispense:  20 tablet    Refill:  0    ASSESSMENT   1. Nausea, vomiting, and diarrhea   2. [redacted] weeks gestation of pregnancy     PLAN  Discharge home in stable condition with return  precautions if new fever, vaginal bleeding or discharge, urinary symptoms, contractions, LOF.  Use Zofran at home for nausea. Encourage to stay hydrated and each small amounts of bland foods every 1-2 hours.   Provided list of OTC medications that are safe in pregnancy.   Allergies as of 11/11/2023       Reactions   Peanut-containing Drug Products Hives, Swelling        Medication List     TAKE these medications    folic acid 1 MG tablet Commonly known as: FOLVITE Take 1 mg by mouth daily.   ondansetron 4 MG tablet Commonly known as: Zofran Take 1 tablet (4 mg total) by mouth every 8 (eight) hours as needed for nausea or vomiting.   prenatal multivitamin Tabs tablet Take 1 tablet by mouth daily at 12 noon.        Melida Quitter, PA

## 2023-11-11 NOTE — Discharge Instructions (Signed)

## 2023-11-19 ENCOUNTER — Ambulatory Visit: Admitting: Obstetrics

## 2023-11-19 ENCOUNTER — Ambulatory Visit: Attending: Obstetrics

## 2023-11-19 ENCOUNTER — Other Ambulatory Visit: Payer: Self-pay | Admitting: *Deleted

## 2023-11-19 VITALS — BP 120/60 | HR 76

## 2023-11-19 DIAGNOSIS — Z363 Encounter for antenatal screening for malformations: Secondary | ICD-10-CM | POA: Diagnosis not present

## 2023-11-19 DIAGNOSIS — O99012 Anemia complicating pregnancy, second trimester: Secondary | ICD-10-CM

## 2023-11-19 DIAGNOSIS — O99212 Obesity complicating pregnancy, second trimester: Secondary | ICD-10-CM | POA: Diagnosis present

## 2023-11-19 DIAGNOSIS — Z3480 Encounter for supervision of other normal pregnancy, unspecified trimester: Secondary | ICD-10-CM | POA: Diagnosis present

## 2023-11-19 DIAGNOSIS — O99891 Other specified diseases and conditions complicating pregnancy: Secondary | ICD-10-CM | POA: Insufficient documentation

## 2023-11-19 DIAGNOSIS — Z362 Encounter for other antenatal screening follow-up: Secondary | ICD-10-CM | POA: Insufficient documentation

## 2023-11-19 DIAGNOSIS — O321XX0 Maternal care for breech presentation, not applicable or unspecified: Secondary | ICD-10-CM | POA: Diagnosis not present

## 2023-11-19 DIAGNOSIS — O365990 Maternal care for other known or suspected poor fetal growth, unspecified trimester, not applicable or unspecified: Secondary | ICD-10-CM

## 2023-11-19 DIAGNOSIS — E669 Obesity, unspecified: Secondary | ICD-10-CM | POA: Diagnosis not present

## 2023-11-19 DIAGNOSIS — Z3A23 23 weeks gestation of pregnancy: Secondary | ICD-10-CM | POA: Insufficient documentation

## 2023-11-19 DIAGNOSIS — O280 Abnormal hematological finding on antenatal screening of mother: Secondary | ICD-10-CM | POA: Insufficient documentation

## 2023-11-19 DIAGNOSIS — O99810 Abnormal glucose complicating pregnancy: Secondary | ICD-10-CM | POA: Insufficient documentation

## 2023-11-19 DIAGNOSIS — R7303 Prediabetes: Secondary | ICD-10-CM | POA: Diagnosis not present

## 2023-11-19 DIAGNOSIS — O365920 Maternal care for other known or suspected poor fetal growth, second trimester, not applicable or unspecified: Secondary | ICD-10-CM | POA: Diagnosis not present

## 2023-11-19 DIAGNOSIS — D563 Thalassemia minor: Secondary | ICD-10-CM

## 2023-11-19 NOTE — Progress Notes (Signed)
 MFM Consult Note  [name] is currently at 23 weeks and 3 days.  She has been followed due to maternal obesity.  Her Horizon test indicated that she is a silent carrier for alpha thalassemia.  Her partner was screened and he was identified to be a carrier for alpha thalassemia in the trans configuration.  Based on the couples results, there would be a 50% chance that their child would be a trans carrier for alpha thalassemia and a 50% chance to be a silent carrier for alpha thalassemia.  It is unlikely that their child will be affected with alpha thalassemia.  On today's exam, the EFW of 1 pound 1 ounces measures at the 5th percentile for her gestational age indicating IUGR.    There was normal amniotic fluid noted on today's exam.  Fetal movements were noted throughout today's exam.    Doppler studies of the umbilical arteries showed normal forward flow.  There were no signs of absent or reversed end-diastolic flow.    Due to IUGR, she will return in 2 weeks for a umbilical artery Doppler study.  We will repeat a growth scan in 3 weeks.  A total of 20 minutes was spent counseling and coordinating the care for this patient.  Greater than 50% of the time was spent in direct face-to-face contact.

## 2023-11-30 DIAGNOSIS — O365990 Maternal care for other known or suspected poor fetal growth, unspecified trimester, not applicable or unspecified: Secondary | ICD-10-CM | POA: Insufficient documentation

## 2023-11-30 DIAGNOSIS — Z8759 Personal history of other complications of pregnancy, childbirth and the puerperium: Secondary | ICD-10-CM | POA: Insufficient documentation

## 2023-11-30 DIAGNOSIS — D563 Thalassemia minor: Secondary | ICD-10-CM | POA: Insufficient documentation

## 2023-11-30 NOTE — Progress Notes (Unsigned)
   PRENATAL VISIT NOTE  Subjective:  Ashley Weber is a 28 y.o. G1P0000 at [redacted]w[redacted]d being seen today for ongoing prenatal care.  She is currently monitored for the following issues for this high-risk pregnancy and has Supervision of other normal pregnancy, antepartum; Alpha thalassemia silent carrier; and Fetal growth restriction antepartum on their problem list.   Patient reports backache radiating down left.  Contractions: Not present. Vag. Bleeding: None.  Movement: Present. Denies leaking of fluid.   The following portions of the patient's history were reviewed and updated as appropriate: allergies, current medications, past family history, past medical history, past social history, past surgical history and problem list.   Objective:   Vitals:   12/01/23 1003  BP: 125/84  Pulse: 96  Weight: 246 lb 11.2 oz (111.9 kg)    Fetal Status: Fetal Heart Rate (bpm): 148   Movement: Present     General:  Alert, oriented and cooperative. Patient is in no acute distress.  Skin: Skin is warm and dry. No rash noted.   Cardiovascular: Normal heart rate noted  Respiratory: Normal respiratory effort, no problems with respiration noted  Abdomen: Soft, gravid, appropriate for gestational age.  Pain/Pressure: Absent     Pelvic: Cervical exam deferred        Extremities: Normal range of motion.  Edema: None  Mental Status: Normal mood and affect. Normal behavior. Normal judgment and thought content.  23 week US  4.7th %tile  Assessment and Plan:  Pregnancy: G1P0000 at [redacted]w[redacted]d 1. Supervision of other normal pregnancy, antepartum (Primary)  2. [redacted] weeks gestation of pregnancy - 28 week labs NV 3. Alpha thalassemia silent carrier - Result pending 4. Fetal growth restriction antepartum - Antenatal testing per MFM 5. Acute left-sided low back pain with left-sided sciatica - Ambulatory referral to Physical Therapy - Antenatal testing and growth US 's per MFM 6. Rh negative - Rho NV Preterm labor  symptoms and general obstetric precautions including but not limited to vaginal bleeding, contractions, leaking of fluid and fetal movement were reviewed in detail with the patient. Please refer to After Visit Summary for other counseling recommendations.   Return in about 4 weeks (around 12/29/2023) for ROB/GTT.  Future Appointments  Date Time Provider Department Center  12/11/2023  8:00 AM [facility] PROVIDER 1 [facility]  12/11/2023  8:30 AM [facility] US6 [facility]    Paarth Cropper  Benard Brackett [facility] for [employer]

## 2023-12-01 ENCOUNTER — Ambulatory Visit: Admitting: Advanced Practice Midwife

## 2023-12-01 ENCOUNTER — Other Ambulatory Visit: Payer: Self-pay

## 2023-12-01 VITALS — BP 125/84 | HR 96 | Wt 246.7 lb

## 2023-12-01 DIAGNOSIS — O365990 Maternal care for other known or suspected poor fetal growth, unspecified trimester, not applicable or unspecified: Secondary | ICD-10-CM

## 2023-12-01 DIAGNOSIS — Z3480 Encounter for supervision of other normal pregnancy, unspecified trimester: Secondary | ICD-10-CM

## 2023-12-01 DIAGNOSIS — M5442 Lumbago with sciatica, left side: Secondary | ICD-10-CM | POA: Diagnosis not present

## 2023-12-01 DIAGNOSIS — Z3A25 25 weeks gestation of pregnancy: Secondary | ICD-10-CM | POA: Diagnosis not present

## 2023-12-01 DIAGNOSIS — D563 Thalassemia minor: Secondary | ICD-10-CM

## 2023-12-01 DIAGNOSIS — O365920 Maternal care for other known or suspected poor fetal growth, second trimester, not applicable or unspecified: Secondary | ICD-10-CM | POA: Diagnosis not present

## 2023-12-11 ENCOUNTER — Other Ambulatory Visit: Payer: Self-pay | Admitting: *Deleted

## 2023-12-11 ENCOUNTER — Ambulatory Visit

## 2023-12-11 ENCOUNTER — Ambulatory Visit: Attending: Obstetrics | Admitting: Obstetrics

## 2023-12-11 VITALS — BP 126/69 | HR 82

## 2023-12-11 DIAGNOSIS — O365920 Maternal care for other known or suspected poor fetal growth, second trimester, not applicable or unspecified: Secondary | ICD-10-CM | POA: Insufficient documentation

## 2023-12-11 DIAGNOSIS — O99810 Abnormal glucose complicating pregnancy: Secondary | ICD-10-CM | POA: Diagnosis not present

## 2023-12-11 DIAGNOSIS — O365990 Maternal care for other known or suspected poor fetal growth, unspecified trimester, not applicable or unspecified: Secondary | ICD-10-CM

## 2023-12-11 DIAGNOSIS — O99212 Obesity complicating pregnancy, second trimester: Secondary | ICD-10-CM

## 2023-12-11 DIAGNOSIS — Z3480 Encounter for supervision of other normal pregnancy, unspecified trimester: Secondary | ICD-10-CM

## 2023-12-11 DIAGNOSIS — Z3A26 26 weeks gestation of pregnancy: Secondary | ICD-10-CM | POA: Diagnosis not present

## 2023-12-11 DIAGNOSIS — Z363 Encounter for antenatal screening for malformations: Secondary | ICD-10-CM | POA: Diagnosis not present

## 2023-12-11 DIAGNOSIS — E669 Obesity, unspecified: Secondary | ICD-10-CM

## 2023-12-11 NOTE — Progress Notes (Signed)
 MFM Consult Note  Ashley Weber is currently at 26 weeks and 4 days.  She has been followed due to maternal obesity and IUGR noted on her prior exam.      She denies any problems since her last exam and reports feeling fetal movements throughout the day.  Her early screen for gestational diabetes was negative.  On today's exam, the EFW of 1 pound 12 ounces measures at the 7th percentile for her gestational age indicating IUGR.  The fetus has grown 11 ounces over the past 3 weeks.    The total AFI was 21.12 cm (within normal limits).  Fetal movements were noted throughout today's exam.  Doppler studies of the umbilical arteries showed a normal S/D ratio of 3.44 .  There were no signs of absent or reversed end-diastolic flow.    Due to IUGR, we will continue to follow her closely with frequent fetal testing and umbilical artery Doppler studies.  We will start NSTs at 28 weeks.  She will return in 2 weeks for an NST and umbilical artery Doppler study.  The patient stated that all of her questions were answered today.  A total of 20 minutes was spent counseling and coordinating the care for this patient. Greater than 50% of the time was spent in direct face-to-face contact.

## 2023-12-15 ENCOUNTER — Encounter: Payer: Self-pay | Admitting: Certified Nurse Midwife

## 2023-12-28 ENCOUNTER — Other Ambulatory Visit: Payer: Self-pay

## 2023-12-28 DIAGNOSIS — Z3480 Encounter for supervision of other normal pregnancy, unspecified trimester: Secondary | ICD-10-CM

## 2023-12-28 NOTE — Progress Notes (Unsigned)
   PRENATAL VISIT NOTE  Subjective:  Ashley Weber is a 28 y.o. G1P0000 at [redacted]w[redacted]d being seen today for ongoing prenatal care.  She is currently monitored for the following issues for this {Blank single:19197::"high-risk","low-risk"} pregnancy and has Supervision of other normal pregnancy, antepartum; Alpha thalassemia silent carrier; and Fetal growth restriction antepartum on their problem list.  Patient reports {sx:14538}.   .  .   . Denies leaking of fluid.   The following portions of the patient's history were reviewed and updated as appropriate: allergies, current medications, past family history, past medical history, past social history, past surgical history and problem list.   Objective:  There were no vitals filed for this visit.   Fetal Status:            General: Alert, oriented and cooperative. Patient is in no acute distress.  Skin: Skin is warm and dry. No rash noted.   Cardiovascular: Normal heart rate noted  Respiratory: Normal respiratory effort, no problems with respiration noted  Abdomen: Soft, gravid, appropriate for gestational age.        Pelvic: {Blank single:19197::"Cervical exam performed in the presence of a chaperone","Cervical exam deferred"}        Extremities: Normal range of motion.     Mental Status: Normal mood and affect. Normal behavior. Normal judgment and thought content.   Assessment and Plan:  Pregnancy: G1P0000 at [redacted]w[redacted]d 1. Supervision of other normal pregnancy, antepartum (Primary) ***  2. [redacted] weeks gestation of pregnancy ***  3. Fetal growth restriction antepartum ***  {Blank single:19197::"Term","Preterm"} labor symptoms and general obstetric precautions including but not limited to vaginal bleeding, contractions, leaking of fluid and fetal movement were reviewed in detail with the patient. Please refer to After Visit Summary for other counseling recommendations.   No follow-ups on file.  Future Appointments  Date Time Provider  Department Center  12/29/2023  8:20 AM [facility]  12/29/2023  9:35 AM Curlie Doughty [facility]  01/01/2024  7:00 AM [facility] PROVIDER 1 [facility]  01/01/2024  7:30 AM [facility] US3 [facility]  01/01/2024  8:45 AM [facility] NST [facility]  01/08/2024  7:00 AM [facility] PROVIDER 1 [facility]  01/08/2024  7:30 AM [facility] US5 [facility]  01/08/2024  8:45 AM [facility] NST [facility]    Salomon Cree, CNM

## 2023-12-29 ENCOUNTER — Other Ambulatory Visit

## 2023-12-29 ENCOUNTER — Ambulatory Visit: Payer: Self-pay | Admitting: Certified Nurse Midwife

## 2023-12-29 ENCOUNTER — Encounter: Payer: Self-pay | Admitting: Certified Nurse Midwife

## 2023-12-29 ENCOUNTER — Other Ambulatory Visit: Payer: Self-pay

## 2023-12-29 DIAGNOSIS — Z6791 Unspecified blood type, Rh negative: Secondary | ICD-10-CM | POA: Insufficient documentation

## 2023-12-29 DIAGNOSIS — O365990 Maternal care for other known or suspected poor fetal growth, unspecified trimester, not applicable or unspecified: Secondary | ICD-10-CM

## 2023-12-29 DIAGNOSIS — Z3A29 29 weeks gestation of pregnancy: Secondary | ICD-10-CM

## 2023-12-29 DIAGNOSIS — Z3480 Encounter for supervision of other normal pregnancy, unspecified trimester: Secondary | ICD-10-CM

## 2023-12-29 DIAGNOSIS — O26899 Other specified pregnancy related conditions, unspecified trimester: Secondary | ICD-10-CM | POA: Insufficient documentation

## 2023-12-31 ENCOUNTER — Other Ambulatory Visit

## 2023-12-31 ENCOUNTER — Ambulatory Visit (INDEPENDENT_AMBULATORY_CARE_PROVIDER_SITE_OTHER): Admitting: Family Medicine

## 2023-12-31 ENCOUNTER — Other Ambulatory Visit: Payer: Self-pay

## 2023-12-31 ENCOUNTER — Encounter: Payer: Self-pay | Admitting: Family Medicine

## 2023-12-31 VITALS — BP 119/83 | HR 78 | Wt 250.4 lb

## 2023-12-31 DIAGNOSIS — O365930 Maternal care for other known or suspected poor fetal growth, third trimester, not applicable or unspecified: Secondary | ICD-10-CM

## 2023-12-31 DIAGNOSIS — Z6791 Unspecified blood type, Rh negative: Secondary | ICD-10-CM | POA: Diagnosis not present

## 2023-12-31 DIAGNOSIS — O26893 Other specified pregnancy related conditions, third trimester: Secondary | ICD-10-CM

## 2023-12-31 DIAGNOSIS — Z3480 Encounter for supervision of other normal pregnancy, unspecified trimester: Secondary | ICD-10-CM

## 2023-12-31 DIAGNOSIS — Z3A29 29 weeks gestation of pregnancy: Secondary | ICD-10-CM | POA: Diagnosis not present

## 2023-12-31 DIAGNOSIS — Z23 Encounter for immunization: Secondary | ICD-10-CM

## 2023-12-31 DIAGNOSIS — D563 Thalassemia minor: Secondary | ICD-10-CM | POA: Diagnosis not present

## 2023-12-31 DIAGNOSIS — O365990 Maternal care for other known or suspected poor fetal growth, unspecified trimester, not applicable or unspecified: Secondary | ICD-10-CM

## 2023-12-31 MED ORDER — RHO D IMMUNE GLOBULIN 1500 UNIT/2ML IJ SOSY
300.0000 ug | PREFILLED_SYRINGE | Freq: Once | INTRAMUSCULAR | Status: AC
  Administered 2023-12-31: 300 ug via INTRAMUSCULAR

## 2023-12-31 NOTE — Progress Notes (Signed)
   Subjective:  Ashley Weber is a 28 y.o. G1P0000 at [redacted]w[redacted]d being seen today for ongoing prenatal care.  She is currently monitored for the following issues for this high-risk pregnancy and has Supervision of other normal pregnancy, antepartum; Alpha thalassemia silent carrier; Fetal growth restriction antepartum; and Rh negative status during pregnancy on their problem list.  Patient reports no complaints.  Contractions: Not present.  .  Movement: Present. Denies leaking of fluid.   The following portions of the patient's history were reviewed and updated as appropriate: allergies, current medications, past family history, past medical history, past social history, past surgical history and problem list. Problem list updated.  Objective:   Vitals:   12/31/23 0920  BP: 119/83  Pulse: 78  Weight: 250 lb 6 oz (113.6 kg)    Fetal Status: Fetal Heart Rate (bpm): 160   Movement: Present     General:  Alert, oriented and cooperative. Patient is in no acute distress.  Skin: Skin is warm and dry. No rash noted.   Cardiovascular: Normal heart rate noted  Respiratory: Normal respiratory effort, no problems with respiration noted  Abdomen: Soft, gravid, appropriate for gestational age. Pain/Pressure: Absent     Pelvic:       Cervical exam deferred        Extremities: Normal range of motion.  Edema: None  Mental Status: Normal mood and affect. Normal behavior. Normal judgment and thought content.   Urinalysis:      Assessment and Plan:  Pregnancy: G1P0000 at [redacted]w[redacted]d  1. Supervision of other normal pregnancy, antepartum (Primary) BP and FHR normal Third trimester labs today Offered tdap, given today  2. Rh negative status during pregnancy in third trimester Antibody screen collected Rhogam given  3. Fetal growth restriction antepartum Last growth US  12/11/2023, EFW 7%, 804g, AC 13%, AFI 21 F/w MFM  4. Alpha thalassemia silent carrier Partner is a carrier S/p genetic counseling, see  note from 11/09/2023, baby 50% chance to be a carrier but no risk of alpha thal disease  Preterm labor symptoms and general obstetric precautions including but not limited to vaginal bleeding, contractions, leaking of fluid and fetal movement were reviewed in detail with the patient. Please refer to After Visit Summary for other counseling recommendations.  Return in 2 weeks (on 01/14/2024) for Dyad patient, ob visit.   Teena Feast, MD

## 2023-12-31 NOTE — Patient Instructions (Signed)

## 2024-01-01 ENCOUNTER — Ambulatory Visit (HOSPITAL_BASED_OUTPATIENT_CLINIC_OR_DEPARTMENT_OTHER): Admitting: *Deleted

## 2024-01-01 ENCOUNTER — Ambulatory Visit: Admitting: Obstetrics and Gynecology

## 2024-01-01 ENCOUNTER — Ambulatory Visit: Attending: Obstetrics and Gynecology

## 2024-01-01 ENCOUNTER — Other Ambulatory Visit: Payer: Self-pay | Admitting: *Deleted

## 2024-01-01 VITALS — BP 129/60

## 2024-01-01 DIAGNOSIS — Z3A29 29 weeks gestation of pregnancy: Secondary | ICD-10-CM

## 2024-01-01 DIAGNOSIS — E669 Obesity, unspecified: Secondary | ICD-10-CM | POA: Diagnosis not present

## 2024-01-01 DIAGNOSIS — O365930 Maternal care for other known or suspected poor fetal growth, third trimester, not applicable or unspecified: Secondary | ICD-10-CM | POA: Diagnosis present

## 2024-01-01 DIAGNOSIS — O99810 Abnormal glucose complicating pregnancy: Secondary | ICD-10-CM | POA: Diagnosis not present

## 2024-01-01 DIAGNOSIS — O365990 Maternal care for other known or suspected poor fetal growth, unspecified trimester, not applicable or unspecified: Secondary | ICD-10-CM | POA: Diagnosis present

## 2024-01-01 DIAGNOSIS — O99013 Anemia complicating pregnancy, third trimester: Secondary | ICD-10-CM

## 2024-01-01 DIAGNOSIS — O99213 Obesity complicating pregnancy, third trimester: Secondary | ICD-10-CM | POA: Insufficient documentation

## 2024-01-01 DIAGNOSIS — O365920 Maternal care for other known or suspected poor fetal growth, second trimester, not applicable or unspecified: Secondary | ICD-10-CM | POA: Diagnosis present

## 2024-01-01 DIAGNOSIS — D563 Thalassemia minor: Secondary | ICD-10-CM

## 2024-01-01 LAB — CBC
Hematocrit: 33 % — ABNORMAL LOW (ref 34.0–46.6)
Hemoglobin: 10 g/dL — ABNORMAL LOW (ref 11.1–15.9)
MCH: 26.2 pg — ABNORMAL LOW (ref 26.6–33.0)
MCHC: 30.3 g/dL — ABNORMAL LOW (ref 31.5–35.7)
MCV: 86 fL (ref 79–97)
Platelets: 248 10*3/uL (ref 150–450)
RBC: 3.82 x10E6/uL (ref 3.77–5.28)
RDW: 13 % (ref 11.7–15.4)
WBC: 6 10*3/uL (ref 3.4–10.8)

## 2024-01-01 LAB — GLUCOSE TOLERANCE, 2 HOURS W/ 1HR
Glucose, 1 hour: 168 mg/dL (ref 70–179)
Glucose, 2 hour: 106 mg/dL (ref 70–152)
Glucose, Fasting: 92 mg/dL — ABNORMAL HIGH (ref 70–91)

## 2024-01-01 LAB — ANTIBODY SCREEN: Antibody Screen: NEGATIVE

## 2024-01-01 LAB — HIV ANTIBODY (ROUTINE TESTING W REFLEX): HIV Screen 4th Generation wRfx: NONREACTIVE

## 2024-01-01 LAB — RPR: RPR Ser Ql: NONREACTIVE

## 2024-01-01 NOTE — Progress Notes (Signed)
 Maternal-Fetal Medicine Consultation Name: Ashley Weber MRN: [id number]  G1 P0 at 29w 4d gestation. Fetal growth restriction.  On ultrasound performed 3 weeks ago, the estimated fetal weight was at the 7th percentile. Patient had screening for gestational diabetes yesterday and the results are pending.  Blood pressure today at our office is 129/60 mmHg.  Ultrasound Amniotic fluid is normal good fetal activity seen.  Cephalic presentation.  Estimated fetal weight is at the 9th percentile in the interval fetal weight gain is 423 g.  Abdominal circumference measurement is at the 20th percentile.  Umbilical artery Doppler showed normal forward diastolic flow.  NST is reactive.  I reassured the patient of the findings and adequate interval growth.  I discussed ultrasound protocol for monitoring fetal growth restriction.  Timing of delivery will be decided after future fetal growth assessments. Gestational hypertension/preeclampsia can be preceded by fetal growth restriction in some cases.  Recommendations - NST and UA Doppler next week. - NST in 2 weeks. -Fetal growth assessment and BPP in 3 weeks.   Consultation including face-to-face (more than 50%) counseling 20 minutes.

## 2024-01-01 NOTE — Procedures (Signed)
 Ashley Weber [birth year] [redacted]w[redacted]d  Fetus A Non-Stress Test Interpretation for 01/01/24-NST, UAD, limited  Indication: IUGR  Fetal Heart Rate A Mode: External Baseline Rate (A): 145 bpm Variability: Moderate Accelerations: 15 x 15 Decelerations: None Multiple birth?: No  Uterine Activity Mode: Toco Contraction Frequency (min): none Resting Tone Palpated: Relaxed  Interpretation (Fetal Testing) Nonstress Test Interpretation: Reactive Comments: tracing reviewed byDr. Arnie Bibber

## 2024-01-02 ENCOUNTER — Ambulatory Visit: Payer: Self-pay | Admitting: Certified Nurse Midwife

## 2024-01-04 ENCOUNTER — Telehealth: Payer: Self-pay

## 2024-01-04 DIAGNOSIS — O24410 Gestational diabetes mellitus in pregnancy, diet controlled: Secondary | ICD-10-CM

## 2024-01-04 MED ORDER — ACCU-CHEK GUIDE TEST VI STRP: ORAL_STRIP | 12 refills | Status: DC

## 2024-01-04 MED ORDER — ACCU-CHEK GUIDE W/DEVICE KIT: 1.0000 | PACK | Freq: Once | 0 refills | Status: AC

## 2024-01-04 MED ORDER — ACCU-CHEK SOFTCLIX LANCETS MISC: 12 refills | Status: DC

## 2024-01-04 NOTE — Telephone Encounter (Signed)
 Per S. Warren-Hill, CNM pt needed to be contacted for scheduling with diabetes education and ordering supplies.  Pt notified by phone call of results, pt agreed to diabetes education appointment on 01/07/24 at 09:15 AM.  Pt blood glucose supplies sent to her pharmacy.  Pt verbalized understanding of results and will bring her supplies to the diabetes education appointment.  Carolynne Citron, RN

## 2024-01-04 NOTE — Progress Notes (Deleted)
 Patient was seen for Gestational Diabetes on 01/07/2024  Start time *** and End time ***   Estimated due date: 03/14/2024; [redacted]w[redacted]d  Clinical: Medications: *** Medical History: *** Labs: OGTT fasting 92, 1 hour 168, 2 hour 106 on 12/31/2023 Lab Results  Component Value Date   HGBA1C 5.8 (H) 10/06/2023   Dietary and Lifestyle History: ***  Physical Activity: *** Stress: *** Sleep: ***  24 hr Recall:  First Meal:  *** Snack:  *** Second meal:  *** Snack:  *** Third meal:  *** Snack:  *** Beverages:  ***  NUTRITION INTERVENTION  Nutrition education (E-1) on the following topics:   Initial Follow-up  []  []  Definition of Gestational Diabetes []  []  Why dietary management is important in controlling blood glucose []  []  Effects each nutrient has on blood glucose levels []  []  Simple carbohydrates vs complex carbohydrates []  []  Fluid intake []  []  Creating a balanced meal plan []  []  Carbohydrate counting  []  []  When to check blood glucose levels []  []  Proper blood glucose monitoring techniques []  []  Effect of stress and stress reduction techniques  []  []  Exercise effect on blood glucose levels, appropriate exercise during pregnancy []  []  Importance of limiting caffeine and abstaining from alcohol and smoking []  []  Medications used for blood sugar control during pregnancy []  []  Hypoglycemia and rule of 15 []  []  Postpartum self care  Blood glucose monitor given: *** Lot # *** Exp: *** CBG: *** mg/dL  *** Patient has a meter prior to visit. Patient is *** testing pre breakfast and 2 hours after each meal. FBS: *** Postprandial: ***  Patient instructed to monitor glucose levels: FBS: 60 - <= 95 mg/dL; 2 hour: <= 161 mg/dL  Patient received handouts: Nutrition Diabetes and Pregnancy Carbohydrate Counting List Blood glucose log Snack ideas for diabetes during pregnancy  Patient will be seen for follow-up as needed.

## 2024-01-07 ENCOUNTER — Other Ambulatory Visit

## 2024-01-08 ENCOUNTER — Ambulatory Visit (HOSPITAL_BASED_OUTPATIENT_CLINIC_OR_DEPARTMENT_OTHER): Admitting: Obstetrics

## 2024-01-08 ENCOUNTER — Other Ambulatory Visit: Payer: Self-pay | Admitting: *Deleted

## 2024-01-08 ENCOUNTER — Ambulatory Visit (HOSPITAL_BASED_OUTPATIENT_CLINIC_OR_DEPARTMENT_OTHER)

## 2024-01-08 ENCOUNTER — Ambulatory Visit: Attending: Obstetrics

## 2024-01-08 VITALS — BP 145/72 | HR 79

## 2024-01-08 DIAGNOSIS — Z3A30 30 weeks gestation of pregnancy: Secondary | ICD-10-CM

## 2024-01-08 DIAGNOSIS — O24410 Gestational diabetes mellitus in pregnancy, diet controlled: Secondary | ICD-10-CM

## 2024-01-08 DIAGNOSIS — Z3480 Encounter for supervision of other normal pregnancy, unspecified trimester: Secondary | ICD-10-CM | POA: Insufficient documentation

## 2024-01-08 DIAGNOSIS — O99013 Anemia complicating pregnancy, third trimester: Secondary | ICD-10-CM

## 2024-01-08 DIAGNOSIS — O365930 Maternal care for other known or suspected poor fetal growth, third trimester, not applicable or unspecified: Secondary | ICD-10-CM

## 2024-01-08 DIAGNOSIS — O99213 Obesity complicating pregnancy, third trimester: Secondary | ICD-10-CM

## 2024-01-08 DIAGNOSIS — D563 Thalassemia minor: Secondary | ICD-10-CM

## 2024-01-08 DIAGNOSIS — Z6791 Unspecified blood type, Rh negative: Secondary | ICD-10-CM | POA: Insufficient documentation

## 2024-01-08 DIAGNOSIS — E669 Obesity, unspecified: Secondary | ICD-10-CM | POA: Diagnosis not present

## 2024-01-08 DIAGNOSIS — O365931 Maternal care for other known or suspected poor fetal growth, third trimester, fetus 1: Secondary | ICD-10-CM

## 2024-01-08 DIAGNOSIS — O365920 Maternal care for other known or suspected poor fetal growth, second trimester, not applicable or unspecified: Secondary | ICD-10-CM | POA: Insufficient documentation

## 2024-01-08 DIAGNOSIS — O26893 Other specified pregnancy related conditions, third trimester: Secondary | ICD-10-CM | POA: Diagnosis not present

## 2024-01-08 NOTE — Procedures (Signed)
 Ashley Weber 10-17-1995 [redacted]w[redacted]d  Fetus A Non-Stress Test Interpretation for 01/08/24  Indication: IUGR  Fetal Heart Rate A Mode: External Baseline Rate (A): 130 bpm Variability: Moderate Accelerations: 10 x 10 Decelerations: None Multiple birth?: No  Uterine Activity Mode: Palpation, Toco Contraction Frequency (min): none noted Resting Tone Palpated: Relaxed  Interpretation (Fetal Testing) Nonstress Test Interpretation: Reactive Comments: Reviewed with Dr. Grayland Le

## 2024-01-08 NOTE — Progress Notes (Signed)
 MFM Consult Note  Ashley Weber is currently at 30 weeks and 4 days.  She has been followed due to maternal obesity and IUGR.     The overall EFW obtained last week was 2 pounds 11 ounces (9th percentile).  She denies any problems since her last exam and reports feeling fetal movements throughout the day.  On today's exam, there was normal amniotic fluid noted with a total AFI of 21.14 cm.  Doppler studies of the umbilical arteries showed a normal S/D ratio of 3.18.  There were no signs of absent or reversed end-diastolic flow.    She also had a reactive NST for her gestational age today.  Due to IUGR, she will return in 1 week for another umbilical artery Doppler study and NST . The patient was advised that should IUGR continue to be noted later in her pregnancy, delivery will be recommended at between 37 to 38 weeks.    She stated that all of her questions were answered today.  A total of 10 minutes was spent counseling and coordinating the care for this patient.  Greater than 50% of the time was spent in direct face-to-face contact.

## 2024-01-13 NOTE — Progress Notes (Deleted)
 Patient was seen for Gestational Diabetes on 01/14/2024   Start time *** and End time ***   Estimated due date: 03/14/2024; [redacted]w[redacted]d  Clinical: Medications: *** Medical History: *** Labs: OGTT fasting 92, 1 hour 168, 2 hour 106 obtained 12/31/2023    Lab Results  Component Value Date   HGBA1C 5.8 (H) 10/06/2023    Dietary and Lifestyle History: ***  Physical Activity: *** Stress: *** Sleep: ***  24 hr Recall:  First Meal:  *** Snack:  *** Second meal:  *** Snack:  *** Third meal:  *** Snack:  *** Beverages:  ***  NUTRITION INTERVENTION  Nutrition education (E-1) on the following topics:   Initial Follow-up  []  []  Definition of Gestational Diabetes []  []  Why dietary management is important in controlling blood glucose []  []  Effects each nutrient has on blood glucose levels []  []  Simple carbohydrates vs complex carbohydrates []  []  Fluid intake []  []  Creating a balanced meal plan []  []  Carbohydrate counting  []  []  When to check blood glucose levels []  []  Proper blood glucose monitoring techniques []  []  Effect of stress and stress reduction techniques  []  []  Exercise effect on blood glucose levels, appropriate exercise during pregnancy []  []  Importance of limiting caffeine and abstaining from alcohol and smoking []  []  Medications used for blood sugar control during pregnancy []  []  Hypoglycemia and rule of 15 []  []  Postpartum self care  Blood glucose monitor given: *** Lot # *** Exp: *** CBG: *** mg/dL  *** Patient has a meter prior to visit. Patient is *** testing pre breakfast and 2 hours after each meal. FBS: *** Postprandial: ***  Patient instructed to monitor glucose levels:QID FBS: 60 - <= 95 mg/dL; 2 hour: <= 879 mg/dL  Patient received handouts: Nutrition Diabetes and Pregnancy Carbohydrate Counting List Blood glucose log Snack ideas for diabetes during pregnancy  Patient will be seen for follow-up as needed.

## 2024-01-14 ENCOUNTER — Other Ambulatory Visit

## 2024-01-14 DIAGNOSIS — O24410 Gestational diabetes mellitus in pregnancy, diet controlled: Secondary | ICD-10-CM

## 2024-01-18 ENCOUNTER — Other Ambulatory Visit: Payer: Self-pay

## 2024-01-18 ENCOUNTER — Ambulatory Visit: Admitting: Obstetrics and Gynecology

## 2024-01-18 VITALS — BP 121/78 | HR 69 | Wt 247.6 lb

## 2024-01-18 DIAGNOSIS — Z6791 Unspecified blood type, Rh negative: Secondary | ICD-10-CM

## 2024-01-18 DIAGNOSIS — O26893 Other specified pregnancy related conditions, third trimester: Secondary | ICD-10-CM | POA: Diagnosis not present

## 2024-01-18 DIAGNOSIS — O365930 Maternal care for other known or suspected poor fetal growth, third trimester, not applicable or unspecified: Secondary | ICD-10-CM | POA: Diagnosis not present

## 2024-01-18 DIAGNOSIS — O24410 Gestational diabetes mellitus in pregnancy, diet controlled: Secondary | ICD-10-CM

## 2024-01-18 DIAGNOSIS — O360130 Maternal care for anti-D [Rh] antibodies, third trimester, not applicable or unspecified: Secondary | ICD-10-CM

## 2024-01-18 DIAGNOSIS — D563 Thalassemia minor: Secondary | ICD-10-CM

## 2024-01-18 DIAGNOSIS — O365990 Maternal care for other known or suspected poor fetal growth, unspecified trimester, not applicable or unspecified: Secondary | ICD-10-CM

## 2024-01-18 DIAGNOSIS — O99113 Other diseases of the blood and blood-forming organs and certain disorders involving the immune mechanism complicating pregnancy, third trimester: Secondary | ICD-10-CM | POA: Diagnosis not present

## 2024-01-18 DIAGNOSIS — Z3480 Encounter for supervision of other normal pregnancy, unspecified trimester: Secondary | ICD-10-CM

## 2024-01-18 DIAGNOSIS — Z3A32 32 weeks gestation of pregnancy: Secondary | ICD-10-CM

## 2024-01-18 NOTE — Progress Notes (Signed)
   PRENATAL VISIT NOTE  Subjective:  Ashley Weber is a 28 y.o. G1P0000 at [redacted]w[redacted]d being seen today for ongoing prenatal care.  She is currently monitored for the following issues for this high-risk pregnancy and has Supervision of other normal pregnancy, antepartum; Alpha thalassemia silent carrier; Fetal growth restriction antepartum; and Rh negative status during pregnancy on their problem list.  Patient reports not been able to pick up supplies  yet, problem at pharmacy.  Contractions: Not present. Vag. Bleeding: None.  Movement: Present. Denies leaking of fluid.   The following portions of the patient's history were reviewed and updated as appropriate: allergies, current medications, past family history, past medical history, past social history, past surgical history and problem list.   Objective:    Vitals:   01/18/24 0925  BP: 121/78  Pulse: 69  Weight: 247 lb 9.6 oz (112.3 kg)    Fetal Status:  Fetal Heart Rate (bpm): 132   Movement: Present    General: Alert, oriented and cooperative. Patient is in no acute distress.  Skin: Skin is warm and dry. No rash noted.   Cardiovascular: Normal heart rate noted  Respiratory: Normal respiratory effort, no problems with respiration noted  Abdomen: Soft, gravid, appropriate for gestational age.  Pain/Pressure: Absent     Pelvic: Cervical exam deferred        Extremities: Normal range of motion.  Edema: None  Mental Status: Normal mood and affect. Normal behavior. Normal judgment and thought content.   Assessment and Plan:  Pregnancy: G1P0000 at [redacted]w[redacted]d 1. Supervision of other normal pregnancy, antepartum (Primary) BP and FHR normal Doing well, feeling regular movement    2. Rh negative status during pregnancy in third trimester S/p rhogam 5/15  3. Fetal growth restriction antepartum 5/23 afi normal, normal dopplers, reactive NST, 5/18 EFW 9% Continue growth u/s and weekly testing  Recommended delivery 37-38 wks  4. Alpha  thalassemia silent carrier Met with genetic counselor   5. Diet controlled gestational diabetes mellitus (GDM) in third trimester Pharmacy working on supplies Discussed checking sugars QID, goal of fasting < 95, postprandial <120 Please eat 3 meals and 3 snacks. Do not go long periods of time without eating a meal or a snack.  Encouraged protein with each meal.Limiting carbs, and encouraged  adding exercise. Rescheduling with diabetic educator today  6. [redacted] weeks gestation of pregnancy Peds list given today   Preterm labor symptoms and general obstetric precautions including but not limited to vaginal bleeding, contractions, leaking of fluid and fetal movement were reviewed in detail with the patient. Please refer to After Visit Summary for other counseling recommendations.   Return reschedule with diabetic educator.  Future Appointments  Date Time Provider Department Center  01/28/2024  9:15 AM [facility]  01/28/2024 10:55 AM Zelma Hidden, FNP [facility]  02/11/2024 10:15 AM Zelma Hidden, FNP [facility]    Susi Eric, FNP

## 2024-01-18 NOTE — Patient Instructions (Signed)

## 2024-01-21 ENCOUNTER — Other Ambulatory Visit: Payer: Self-pay | Admitting: *Deleted

## 2024-01-21 ENCOUNTER — Ambulatory Visit

## 2024-01-21 ENCOUNTER — Ambulatory Visit: Attending: Obstetrics and Gynecology | Admitting: Obstetrics and Gynecology

## 2024-01-21 VITALS — BP 126/68 | HR 79

## 2024-01-21 DIAGNOSIS — O99013 Anemia complicating pregnancy, third trimester: Secondary | ICD-10-CM

## 2024-01-21 DIAGNOSIS — O99213 Obesity complicating pregnancy, third trimester: Secondary | ICD-10-CM | POA: Diagnosis not present

## 2024-01-21 DIAGNOSIS — E669 Obesity, unspecified: Secondary | ICD-10-CM | POA: Diagnosis not present

## 2024-01-21 DIAGNOSIS — Z3A32 32 weeks gestation of pregnancy: Secondary | ICD-10-CM | POA: Insufficient documentation

## 2024-01-21 DIAGNOSIS — O365990 Maternal care for other known or suspected poor fetal growth, unspecified trimester, not applicable or unspecified: Secondary | ICD-10-CM

## 2024-01-21 DIAGNOSIS — Z8632 Personal history of gestational diabetes: Secondary | ICD-10-CM | POA: Insufficient documentation

## 2024-01-21 DIAGNOSIS — D563 Thalassemia minor: Secondary | ICD-10-CM

## 2024-01-21 DIAGNOSIS — O24410 Gestational diabetes mellitus in pregnancy, diet controlled: Secondary | ICD-10-CM | POA: Insufficient documentation

## 2024-01-21 DIAGNOSIS — Z148 Genetic carrier of other disease: Secondary | ICD-10-CM | POA: Insufficient documentation

## 2024-01-21 DIAGNOSIS — O365930 Maternal care for other known or suspected poor fetal growth, third trimester, not applicable or unspecified: Secondary | ICD-10-CM | POA: Diagnosis not present

## 2024-01-21 HISTORY — DX: Personal history of gestational diabetes: Z86.32

## 2024-01-21 NOTE — Progress Notes (Signed)
 Maternal-Fetal Medicine Consultation Name: Ashley Weber MRN: [id number]  G1 P0 at 32w 3d gestation.  Patient is here for fetal growth assessment and antenatal testing. Fetal growth restriction.  On ultrasound performed 3 weeks ago, the estimated fetal weight was at the 9th percentile. Patient has gestational diabetes and is yet to start checking her blood glucose.  She will be picking up glucometer from the pharmacy. Her blood pressure today at our office is 137/77 mmHg.  Ultrasound On today's ultrasound, the estimated fetal weight is at the 9th percentile and the abdominal circumference measurement is at the 20th percentile.  Interval weight gain is 433 g.  Amniotic fluid is normal good fetal activity seen.  Cephalic presentation.  Umbilical artery Doppler showed normal forward diastolic flow.  Antenatal testing is reassuring.  BPP 8/8.  I discussed the finding of fetal growth restriction (not severe) and good interval fetal growth.  I discussed the importance of weekly antenatal testing.  Will recommend timing of delivery after her next fetal growth assessment in 3 weeks. I encouraged the patient to check her blood glucose regularly and discussed the importance of good control of diabetes to prevent fetal or neonatal adverse outcomes.  Recommendations - Appointments were made for weekly antenatal testing.   Consultation including face-to-face (more than 50%) counseling 20 minutes.

## 2024-01-21 NOTE — Progress Notes (Deleted)
 Patient was seen for Gestational Diabetes on 01/28/2024   Start time *** and End time ***   Estimated due date: 03/14/2024; [redacted]w[redacted]d   Clinical: Medications: *** Medical History: *** Labs: OGTT fasting 92, 1 hour 168, 2 hour 106 on 12/31/2023 Lab Results  Component Value Date   HGBA1C 5.8 (H) 10/06/2023    Dietary and Lifestyle History: ***  Physical Activity: *** Stress: *** Sleep: ***  24 hr Recall:  First Meal:  *** Snack:  *** Second meal:  *** Snack:  *** Third meal:  *** Snack:  *** Beverages:  ***  NUTRITION INTERVENTION  Nutrition education (E-1) on the following topics:   Initial Follow-up  []  []  Definition of Gestational Diabetes []  []  Why dietary management is important in controlling blood glucose []  []  Effects each nutrient has on blood glucose levels []  []  Simple carbohydrates vs complex carbohydrates []  []  Fluid intake []  []  Creating a balanced meal plan []  []  Carbohydrate counting  []  []  When to check blood glucose levels []  []  Proper blood glucose monitoring techniques []  []  Effect of stress and stress reduction techniques  []  []  Exercise effect on blood glucose levels, appropriate exercise during pregnancy []  []  Importance of limiting caffeine and abstaining from alcohol and smoking []  []  Medications used for blood sugar control during pregnancy []  []  Hypoglycemia and rule of 15 []  []  Postpartum self care  Blood glucose monitor given: *** Lot # *** Exp: *** CBG: *** mg/dL  *** Patient has a meter prior to visit. Patient is *** testing pre breakfast and 2 hours after each meal. FBS: *** Postprandial: ***  Patient instructed to monitor glucose levels: FBS: 60 - <= 95 mg/dL; 2 hour: <= 161 mg/dL  Patient received handouts: Nutrition Diabetes and Pregnancy Carbohydrate Counting List Blood glucose log Snack ideas for diabetes during pregnancy  Patient will be seen for follow-up as needed.

## 2024-01-26 ENCOUNTER — Encounter: Payer: Self-pay | Admitting: Certified Nurse Midwife

## 2024-01-26 ENCOUNTER — Other Ambulatory Visit: Payer: Self-pay | Admitting: Lactation Services

## 2024-01-26 MED ORDER — ONETOUCH ULTRASOFT LANCETS MISC: 12 refills | Status: DC

## 2024-01-26 MED ORDER — GLUCOSE BLOOD VI STRP: ORAL_STRIP | 12 refills | Status: DC

## 2024-01-26 MED ORDER — ONETOUCH ULTRA MINI W/DEVICE KIT: 1.0000 | PACK | Freq: Once | 0 refills | Status: AC

## 2024-01-26 NOTE — Progress Notes (Signed)
 Patient not able to afford [employer] blood glucose monitoring supplies. Per internet research, [employer] may cover onetouch. Orders sent to Pharmacy. Will let patient know via Mychart.

## 2024-01-28 ENCOUNTER — Other Ambulatory Visit

## 2024-01-28 ENCOUNTER — Ambulatory Visit: Attending: Obstetrics and Gynecology

## 2024-01-28 ENCOUNTER — Other Ambulatory Visit: Payer: Self-pay | Admitting: *Deleted

## 2024-01-28 ENCOUNTER — Ambulatory Visit: Admitting: Obstetrics and Gynecology

## 2024-01-28 ENCOUNTER — Other Ambulatory Visit: Payer: Self-pay

## 2024-01-28 VITALS — BP 116/77 | HR 73 | Wt 249.6 lb

## 2024-01-28 DIAGNOSIS — Z3A33 33 weeks gestation of pregnancy: Secondary | ICD-10-CM | POA: Diagnosis not present

## 2024-01-28 DIAGNOSIS — Z6791 Unspecified blood type, Rh negative: Secondary | ICD-10-CM | POA: Diagnosis not present

## 2024-01-28 DIAGNOSIS — O24410 Gestational diabetes mellitus in pregnancy, diet controlled: Secondary | ICD-10-CM

## 2024-01-28 DIAGNOSIS — O365930 Maternal care for other known or suspected poor fetal growth, third trimester, not applicable or unspecified: Secondary | ICD-10-CM

## 2024-01-28 DIAGNOSIS — O26893 Other specified pregnancy related conditions, third trimester: Secondary | ICD-10-CM

## 2024-01-28 DIAGNOSIS — O360130 Maternal care for anti-D [Rh] antibodies, third trimester, not applicable or unspecified: Secondary | ICD-10-CM | POA: Diagnosis not present

## 2024-01-28 DIAGNOSIS — O365990 Maternal care for other known or suspected poor fetal growth, unspecified trimester, not applicable or unspecified: Secondary | ICD-10-CM

## 2024-01-28 DIAGNOSIS — Z3480 Encounter for supervision of other normal pregnancy, unspecified trimester: Secondary | ICD-10-CM

## 2024-01-28 NOTE — Progress Notes (Signed)
   PRENATAL VISIT NOTE  Subjective:  Ashley Weber is a 28 y.o. G1P0000 at [redacted]w[redacted]d being seen today for ongoing prenatal care.  She is currently monitored for the following issues for this high-risk pregnancy and has Supervision of other normal pregnancy, antepartum; Alpha thalassemia silent carrier; Fetal growth restriction antepartum; Rh negative status during pregnancy; and Gestational diabetes, diet controlled on their problem list.  Patient reports unable to get supplies.  Contractions: Not present. Vag. Bleeding: None.  Movement: Present. Denies leaking of fluid.   The following portions of the patient's history were reviewed and updated as appropriate: allergies, current medications, past family history, past medical history, past social history, past surgical history and problem list.   Objective:    Vitals:   01/28/24 1128  BP: 116/77  Pulse: 73  Weight: 249 lb 9.6 oz (113.2 kg)    Fetal Status:  Fetal Heart Rate (bpm): 140   Movement: Present    General: Alert, oriented and cooperative. Patient is in no acute distress.  Skin: Skin is warm and dry. No rash noted.   Cardiovascular: Normal heart rate noted  Respiratory: Normal respiratory effort, no problems with respiration noted  Abdomen: Soft, gravid, appropriate for gestational age.  Pain/Pressure: Absent     Pelvic: Cervical exam deferred        Extremities: Normal range of motion.  Edema: None  Mental Status: Normal mood and affect. Normal behavior. Normal judgment and thought content.   Assessment and Plan:  Pregnancy: G1P0000 at [redacted]w[redacted]d 1. Supervision of other normal pregnancy, antepartum (Primary) BP and FHR normal Doing well, feeling regular movement    2. Rh negative status during pregnancy in third trimester S/p rhogam  3. Fetal growth restriction antepartum 6/5 u/s  afi normal, EFW7%, BPP 8/8, normal doppler WEekly antenatal testing, NST today  4. Diet controlled gestational diabetes mellitus (GDM) in  third trimester Discussed diet and exercise last visit, resent supplies the other day and still not covered. Supplies provided today to start checking sugars.  DM education 6/24  5. [redacted] weeks gestation of pregnancy    Preterm labor symptoms and general obstetric precautions including but not limited to vaginal bleeding, contractions, leaking of fluid and fetal movement were reviewed in detail with the patient. Please refer to After Visit Summary for other counseling recommendations.  Future Appointments  Date Time Provider Department Center  01/28/2024  2:15 PM [facility]  02/09/2024  9:15 AM [facility]  02/11/2024 10:15 AM Zelma Hidden, FNP [facility]    Susi Eric, FNP

## 2024-01-28 NOTE — Procedures (Signed)
 Ashley Weber Apr 17, 1996 [redacted]w[redacted]d  Fetus A Non-Stress Test Interpretation for 01/28/24  Indication: IUGR and Gestational Diabetes medication controlled - NST only  Fetal Heart Rate A Mode: External Baseline Rate (A): 140 bpm Variability: Moderate Accelerations: 15 x 15 Decelerations: None Multiple birth?: No  Uterine Activity Mode: Palpation, Toco Contraction Frequency (min): none noted Resting Tone Palpated: Relaxed  Interpretation (Fetal Testing) Nonstress Test Interpretation: Reactive Comments: Reviewed with Dr. Grayland Le

## 2024-01-28 NOTE — Progress Notes (Signed)
 Insurance is not covering Lancets nor strips, so gave her some from Nutrition/ Diabetes.

## 2024-02-09 ENCOUNTER — Other Ambulatory Visit

## 2024-02-11 ENCOUNTER — Ambulatory Visit: Admitting: Maternal & Fetal Medicine

## 2024-02-11 ENCOUNTER — Ambulatory Visit: Attending: Obstetrics

## 2024-02-11 ENCOUNTER — Encounter: Payer: Self-pay | Admitting: Obstetrics and Gynecology

## 2024-02-11 VITALS — BP 122/67

## 2024-02-11 DIAGNOSIS — O360130 Maternal care for anti-D [Rh] antibodies, third trimester, not applicable or unspecified: Secondary | ICD-10-CM

## 2024-02-11 DIAGNOSIS — Z3A35 35 weeks gestation of pregnancy: Secondary | ICD-10-CM | POA: Diagnosis present

## 2024-02-11 DIAGNOSIS — Z3A36 36 weeks gestation of pregnancy: Secondary | ICD-10-CM

## 2024-02-11 DIAGNOSIS — O365930 Maternal care for other known or suspected poor fetal growth, third trimester, not applicable or unspecified: Secondary | ICD-10-CM | POA: Diagnosis not present

## 2024-02-11 DIAGNOSIS — O365990 Maternal care for other known or suspected poor fetal growth, unspecified trimester, not applicable or unspecified: Secondary | ICD-10-CM | POA: Diagnosis present

## 2024-02-11 DIAGNOSIS — Z6791 Unspecified blood type, Rh negative: Secondary | ICD-10-CM | POA: Diagnosis not present

## 2024-02-11 DIAGNOSIS — O24410 Gestational diabetes mellitus in pregnancy, diet controlled: Secondary | ICD-10-CM | POA: Diagnosis present

## 2024-02-11 DIAGNOSIS — O26893 Other specified pregnancy related conditions, third trimester: Secondary | ICD-10-CM

## 2024-02-11 DIAGNOSIS — R519 Headache, unspecified: Secondary | ICD-10-CM

## 2024-02-11 NOTE — Progress Notes (Signed)
   Patient information  Patient Name: Ashley Weber  Patient MRN:   [id number]  Referring practice: MFM Referring Provider: [facility])  Problem List   Patient Active Problem List   Diagnosis Date Noted   Gestational diabetes, diet controlled 01/21/2024   Rh negative status during pregnancy 12/29/2023   Alpha thalassemia silent carrier 11/30/2023   Fetal growth restriction antepartum 11/30/2023   Supervision of other normal pregnancy, antepartum 09/29/2023    Maternal Fetal medicine Consult  Ashley Weber is a 28 y.o. G1P0000 at [redacted]w[redacted]d here for ultrasound and consultation. Ashley Weber is doing well today with no acute concerns. Today we focused on the following:   The patient has gestational diabetes in the setting of fetal growth restriction.  Today the estimated fetal weight is at the 3rd percentile overall and abdominal circumference at the 1.8 percentile.  This is consistent with severe fetal growth restriction.  The umbilical artery Dopplers are normal.  The patient reports that her blood sugars are well-controlled on diet alone.  The patient had time to ask questions that were answered to her satisfaction.  She verbalized understanding and agrees to proceed with the plan below.  Sonographic findings Single intrauterine pregnancy at 35w 3d.  Fetal cardiac activity:  Observed and appears normal. Presentation: Cephalic. Interval fetal anatomy appears normal. Fetal biometry shows the estimated fetal weight at the 3 percentile. Amniotic fluid volume: Within normal limits. MVP: 4.89 cm. Placenta: Anterior.  There are limitations of prenatal ultrasound such as the inability to detect certain abnormalities due to poor visualization. Various factors such as fetal position, gestational age and maternal body habitus may increase the difficulty in visualizing the fetal anatomy.    Recommendations - Continue weekly antenatal testing and umbilical artery  Dopplers until delivery at 37 weeks due to severe growth restriction.  Review of Systems: A review of systems was performed and was negative except per HPI   Vitals and Physical Exam    02/11/2024    7:32 AM 01/28/2024   11:28 AM 01/21/2024   11:12 AM  Vitals with BMI  Weight  249 lbs 10 oz   Systolic 122 116 873  Diastolic 67 77 68  Pulse  73 79    Sitting comfortably on the sonogram table Nonlabored breathing Normal rate and rhythm Abdomen is nontender  Past pregnancies OB History  Gravida Para Term Preterm AB Living  1 0 0 0 0 0  SAB IAB Ectopic Multiple Live Births  0 0 0 0 0    # Outcome Date GA Lbr Len/2nd Weight Sex Type Anes PTL Lv  1 Current              I spent 20 minutes reviewing the patients chart, including labs and images as well as counseling the patient about her medical conditions. Greater than 50% of the time was spent in direct face-to-face patient counseling.  Ashley Weber  MFM, [facility]   02/11/2024  10:55 AM

## 2024-02-16 ENCOUNTER — Encounter: Payer: Self-pay | Admitting: Family Medicine

## 2024-02-16 ENCOUNTER — Other Ambulatory Visit: Payer: Self-pay

## 2024-02-16 ENCOUNTER — Other Ambulatory Visit (HOSPITAL_COMMUNITY)
Admission: RE | Admit: 2024-02-16 | Discharge: 2024-02-16 | Disposition: A | Discharge status: Home / Self Care | Source: Ambulatory Visit | Attending: Family Medicine | Admitting: Family Medicine

## 2024-02-16 ENCOUNTER — Ambulatory Visit: Admitting: Family Medicine

## 2024-02-16 VITALS — BP 122/83 | HR 97 | Wt 248.6 lb

## 2024-02-16 DIAGNOSIS — D563 Thalassemia minor: Secondary | ICD-10-CM

## 2024-02-16 DIAGNOSIS — Z113 Encounter for screening for infections with a predominantly sexual mode of transmission: Secondary | ICD-10-CM | POA: Diagnosis present

## 2024-02-16 DIAGNOSIS — Z3A36 36 weeks gestation of pregnancy: Secondary | ICD-10-CM

## 2024-02-16 DIAGNOSIS — O26893 Other specified pregnancy related conditions, third trimester: Secondary | ICD-10-CM | POA: Diagnosis not present

## 2024-02-16 DIAGNOSIS — O365930 Maternal care for other known or suspected poor fetal growth, third trimester, not applicable or unspecified: Secondary | ICD-10-CM | POA: Diagnosis not present

## 2024-02-16 DIAGNOSIS — O24415 Gestational diabetes mellitus in pregnancy, controlled by oral hypoglycemic drugs: Secondary | ICD-10-CM

## 2024-02-16 DIAGNOSIS — Z6791 Unspecified blood type, Rh negative: Secondary | ICD-10-CM

## 2024-02-16 DIAGNOSIS — O0990 Supervision of high risk pregnancy, unspecified, unspecified trimester: Secondary | ICD-10-CM

## 2024-02-16 DIAGNOSIS — O360130 Maternal care for anti-D [Rh] antibodies, third trimester, not applicable or unspecified: Secondary | ICD-10-CM

## 2024-02-16 DIAGNOSIS — O365990 Maternal care for other known or suspected poor fetal growth, unspecified trimester, not applicable or unspecified: Secondary | ICD-10-CM

## 2024-02-16 DIAGNOSIS — O99113 Other diseases of the blood and blood-forming organs and certain disorders involving the immune mechanism complicating pregnancy, third trimester: Secondary | ICD-10-CM | POA: Diagnosis not present

## 2024-02-16 DIAGNOSIS — R519 Headache, unspecified: Secondary | ICD-10-CM

## 2024-02-16 MED ORDER — METFORMIN HCL 500 MG PO TABS: 500.0000 mg | ORAL_TABLET | Freq: Two times a day (BID) | ORAL | 5 refills | Status: DC

## 2024-02-16 NOTE — Progress Notes (Signed)
   Subjective:  Shell Trahan is a 28 y.o. G1P0000 at [redacted]w[redacted]d being seen today for ongoing prenatal care.  She is currently monitored for the following issues for this high-risk pregnancy and has Supervision of other normal pregnancy, antepartum; Alpha thalassemia silent carrier; Fetal growth restriction antepartum; Rh negative status during pregnancy; and Gestational diabetes, diet controlled on their problem list.  Patient reports headache. Pt states morning HA with occasional floaters that go away after a little bit w/o treatment. Pt with some b/l pedal edema.  Otherwise normal blood pressure and not symptomatic in terms of RUQ pain, CP, SOB.  Contractions: Not present. Vag. Bleeding: None.  Movement: Present. Denies leaking of fluid.   The following portions of the patient's history were reviewed and updated as appropriate: allergies, current medications, past family history, past medical history, past social history, past surgical history and problem list. Problem list updated.  Objective:   Vitals:   02/16/24 0844  BP: 122/83  Pulse: 97  Weight: 248 lb 9 oz (112.7 kg)    Fetal Status: Fetal Heart Rate (bpm): 143 Fundal Height: 34 cm Movement: Present     General:  Alert, oriented and cooperative. Patient is in no acute distress.  Skin: Skin is warm and dry. No rash noted.   Cardiovascular: Normal heart rate noted  Respiratory: Normal respiratory effort, no problems with respiration noted  Abdomen: Soft, gravid, appropriate for gestational age. Pain/Pressure: Absent     Pelvic: Vag. Bleeding: None     Cervical exam deferred        Extremities: Normal range of motion.  Edema: Trace  Mental Status: Normal mood and affect. Normal behavior. Normal judgment and thought content.   Urinalysis:      Assessment and Plan:  Pregnancy: G1P0000 at [redacted]w[redacted]d  1. [redacted] weeks gestation of pregnancy (Primary) 2. Supervision of high risk pregnancy, antepartum GBS and G/C today   3. Fetal growth  restriction antepartum, severe 6/26 @[redacted]w[redacted]d  EFW 3% and AC 1.8%, nrl dopplers Continue weekly antenatal testing and umbilical artery dopplers until delivery at 37wk IOL set for morning 7/9  4. Diet controlled gestational diabetes mellitus (GDM) in third trimester > A2GDM Pt with fastings 100s and 2hr PP 130s, pt states was told 100s/140s  Confirmed with MFM that they were not allowing permissive hyperglycemia Sending Metformin 500mg  BID  5. Rh negative status during pregnancy in third trimester Rhogam on 5/15 PP Rhogam evaluation   6. Alpha thalassemia silent carrier  7. Intermittent headache Pt unsure if she snores but has had some increased degree of daytime fatigue, could be some degree of OSA but will obtain baseline PEC labs as well - Protein / creatinine ratio, urine - Comp Met (CMET) - CBC   Preterm labor symptoms and general obstetric precautions including but not limited to vaginal bleeding, contractions, leaking of fluid and fetal movement were reviewed in detail with the patient. Please refer to After Visit Summary for other counseling recommendations.  Return in about 1 week (around 02/23/2024) for LOB if not delivered .   Jhonny Augustin BROCKS, MD

## 2024-02-16 NOTE — Addendum Note (Signed)
 Addended byBETHA BARTHOLOMEW BARMAN on: 02/16/2024 04:40 PM   Modules accepted: Orders

## 2024-02-17 ENCOUNTER — Telehealth: Payer: Self-pay | Admitting: Family Medicine

## 2024-02-17 ENCOUNTER — Telehealth: Payer: Self-pay | Admitting: Lactation Services

## 2024-02-17 ENCOUNTER — Ambulatory Visit: Payer: Self-pay | Admitting: Family Medicine

## 2024-02-17 LAB — COMPREHENSIVE METABOLIC PANEL WITH GFR
ALT: 9 IU/L (ref 0–32)
AST: 14 IU/L (ref 0–40)
Albumin: 3.7 g/dL — ABNORMAL LOW (ref 4.0–5.0)
Alkaline Phosphatase: 105 IU/L (ref 44–121)
BUN/Creatinine Ratio: 13 (ref 9–23)
BUN: 10 mg/dL (ref 6–20)
Bilirubin Total: 0.5 mg/dL (ref 0.0–1.2)
CO2: 17 mmol/L — ABNORMAL LOW (ref 20–29)
Calcium: 9.4 mg/dL (ref 8.7–10.2)
Chloride: 103 mmol/L (ref 96–106)
Creatinine, Ser: 0.75 mg/dL (ref 0.57–1.00)
Globulin, Total: 2.8 g/dL (ref 1.5–4.5)
Glucose: 88 mg/dL (ref 70–99)
Potassium: 4.4 mmol/L (ref 3.5–5.2)
Sodium: 136 mmol/L (ref 134–144)
Total Protein: 6.5 g/dL (ref 6.0–8.5)
eGFR: 113 mL/min/{1.73_m2} (ref >59)

## 2024-02-17 LAB — CBC
Hematocrit: 32.4 % — ABNORMAL LOW (ref 34.0–46.6)
Hemoglobin: 10.2 g/dL — ABNORMAL LOW (ref 11.1–15.9)
MCH: 26.3 pg — ABNORMAL LOW (ref 26.6–33.0)
MCHC: 31.5 g/dL (ref 31.5–35.7)
MCV: 84 fL (ref 79–97)
Platelets: 257 10*3/uL (ref 150–450)
RBC: 3.88 x10E6/uL (ref 3.77–5.28)
RDW: 13.5 % (ref 11.7–15.4)
WBC: 6.2 10*3/uL (ref 3.4–10.8)

## 2024-02-17 LAB — PROTEIN / CREATININE RATIO, URINE
Creatinine, Urine: 198.7 mg/dL
Protein, Ur: 51.3 mg/dL
Protein/Creat Ratio: 258 mg/g{creat} — ABNORMAL HIGH (ref 0–200)

## 2024-02-17 LAB — GC/CHLAMYDIA PROBE AMP (~~LOC~~) NOT AT ARMC
Chlamydia: NEGATIVE
Comment: NEGATIVE
Comment: NORMAL
Neisseria Gonorrhea: NEGATIVE

## 2024-02-17 NOTE — Telephone Encounter (Signed)
 Called and spoke with patient, she reports she has not had any high blood pressures. She reports the headaches are intermittent and has not had any recently. Reviewed s/s Pre eclampsia and when to seek medical assistance. Patient with office appointment tomorrow. She voiced understanding.

## 2024-02-17 NOTE — Telephone Encounter (Signed)
 Received a call from patient stating there was call from Dr. Jhonny about something important. I told them I would send a message, but they said they would come to the office because they didn't want to wait for a phone call.

## 2024-02-18 ENCOUNTER — Ambulatory Visit: Attending: Obstetrics

## 2024-02-18 ENCOUNTER — Ambulatory Visit: Admitting: Obstetrics and Gynecology

## 2024-02-18 VITALS — BP 131/68

## 2024-02-18 DIAGNOSIS — E669 Obesity, unspecified: Secondary | ICD-10-CM | POA: Diagnosis not present

## 2024-02-18 DIAGNOSIS — O99213 Obesity complicating pregnancy, third trimester: Secondary | ICD-10-CM | POA: Diagnosis not present

## 2024-02-18 DIAGNOSIS — O365930 Maternal care for other known or suspected poor fetal growth, third trimester, not applicable or unspecified: Secondary | ICD-10-CM | POA: Diagnosis not present

## 2024-02-18 DIAGNOSIS — O365990 Maternal care for other known or suspected poor fetal growth, unspecified trimester, not applicable or unspecified: Secondary | ICD-10-CM | POA: Insufficient documentation

## 2024-02-18 DIAGNOSIS — Z3A36 36 weeks gestation of pregnancy: Secondary | ICD-10-CM

## 2024-02-18 DIAGNOSIS — O24410 Gestational diabetes mellitus in pregnancy, diet controlled: Secondary | ICD-10-CM

## 2024-02-18 NOTE — Progress Notes (Signed)
 After review, MFM consult with provider is not indicated for today  Arna Ranks, MD 02/18/2024 12:11 PM  [facility]

## 2024-02-19 LAB — CULTURE, BETA STREP (GROUP B ONLY): Strep Gp B Culture: POSITIVE — AB

## 2024-02-23 ENCOUNTER — Telehealth (HOSPITAL_COMMUNITY): Payer: Self-pay | Admitting: *Deleted

## 2024-02-23 ENCOUNTER — Encounter (HOSPITAL_COMMUNITY): Payer: Self-pay | Admitting: *Deleted

## 2024-02-23 DIAGNOSIS — Z0289 Encounter for other administrative examinations: Secondary | ICD-10-CM

## 2024-02-23 NOTE — Telephone Encounter (Signed)
 Preadmission screen

## 2024-02-24 ENCOUNTER — Inpatient Hospital Stay (HOSPITAL_COMMUNITY)

## 2024-02-24 ENCOUNTER — Inpatient Hospital Stay (HOSPITAL_COMMUNITY)
Admission: RE | Admit: 2024-02-24 | Discharge: 2024-02-27 | DRG: 807 | Disposition: A | Discharge status: Home / Self Care | Attending: Family Medicine | Admitting: Family Medicine

## 2024-02-24 ENCOUNTER — Encounter (HOSPITAL_COMMUNITY): Payer: Self-pay

## 2024-02-24 DIAGNOSIS — O26899 Other specified pregnancy related conditions, unspecified trimester: Secondary | ICD-10-CM

## 2024-02-24 DIAGNOSIS — Z8249 Family history of ischemic heart disease and other diseases of the circulatory system: Secondary | ICD-10-CM | POA: Diagnosis not present

## 2024-02-24 DIAGNOSIS — O26893 Other specified pregnancy related conditions, third trimester: Secondary | ICD-10-CM | POA: Diagnosis present

## 2024-02-24 DIAGNOSIS — Z6791 Unspecified blood type, Rh negative: Secondary | ICD-10-CM | POA: Diagnosis not present

## 2024-02-24 DIAGNOSIS — O9902 Anemia complicating childbirth: Secondary | ICD-10-CM | POA: Diagnosis present

## 2024-02-24 DIAGNOSIS — Z8632 Personal history of gestational diabetes: Secondary | ICD-10-CM | POA: Diagnosis present

## 2024-02-24 DIAGNOSIS — O24410 Gestational diabetes mellitus in pregnancy, diet controlled: Secondary | ICD-10-CM | POA: Diagnosis present

## 2024-02-24 DIAGNOSIS — O99820 Streptococcus B carrier state complicating pregnancy: Secondary | ICD-10-CM | POA: Diagnosis not present

## 2024-02-24 DIAGNOSIS — Z148 Genetic carrier of other disease: Secondary | ICD-10-CM | POA: Diagnosis not present

## 2024-02-24 DIAGNOSIS — Z833 Family history of diabetes mellitus: Secondary | ICD-10-CM | POA: Diagnosis not present

## 2024-02-24 DIAGNOSIS — O365930 Maternal care for other known or suspected poor fetal growth, third trimester, not applicable or unspecified: Secondary | ICD-10-CM | POA: Diagnosis present

## 2024-02-24 DIAGNOSIS — O365990 Maternal care for other known or suspected poor fetal growth, unspecified trimester, not applicable or unspecified: Secondary | ICD-10-CM | POA: Diagnosis present

## 2024-02-24 DIAGNOSIS — O99824 Streptococcus B carrier state complicating childbirth: Secondary | ICD-10-CM | POA: Diagnosis present

## 2024-02-24 DIAGNOSIS — Z3490 Encounter for supervision of normal pregnancy, unspecified, unspecified trimester: Secondary | ICD-10-CM | POA: Diagnosis present

## 2024-02-24 DIAGNOSIS — Z8759 Personal history of other complications of pregnancy, childbirth and the puerperium: Secondary | ICD-10-CM | POA: Diagnosis present

## 2024-02-24 DIAGNOSIS — Z3A37 37 weeks gestation of pregnancy: Secondary | ICD-10-CM

## 2024-02-24 DIAGNOSIS — O24420 Gestational diabetes mellitus in childbirth, diet controlled: Secondary | ICD-10-CM | POA: Diagnosis not present

## 2024-02-24 LAB — GLUCOSE, CAPILLARY
Glucose-Capillary: 105 mg/dL — ABNORMAL HIGH (ref 70–99)
Glucose-Capillary: 74 mg/dL (ref 70–99)
Glucose-Capillary: 80 mg/dL (ref 70–99)
Glucose-Capillary: 80 mg/dL (ref 70–99)
Glucose-Capillary: 92 mg/dL (ref 70–99)

## 2024-02-24 LAB — CBC
HCT: 30.3 % — ABNORMAL LOW (ref 36.0–46.0)
Hemoglobin: 9.8 g/dL — ABNORMAL LOW (ref 12.0–15.0)
MCH: 26 pg (ref 26.0–34.0)
MCHC: 32.3 g/dL (ref 30.0–36.0)
MCV: 80.4 fL (ref 80.0–100.0)
Platelets: 240 K/uL (ref 150–400)
RBC: 3.77 MIL/uL — ABNORMAL LOW (ref 3.87–5.11)
RDW: 13.8 % (ref 11.5–15.5)
WBC: 7.4 K/uL (ref 4.0–10.5)
nRBC: 0 % (ref 0.0–0.2)

## 2024-02-24 LAB — TYPE AND SCREEN
ABO/RH(D): AB NEG
Antibody Screen: POSITIVE

## 2024-02-24 LAB — RPR: RPR Ser Ql: NONREACTIVE

## 2024-02-24 MED ORDER — LIDOCAINE HCL (PF) 1 % IJ SOLN: 30.0000 mL | INTRAMUSCULAR | Status: DC | PRN

## 2024-02-24 MED ORDER — ACETAMINOPHEN 325 MG PO TABS
650.0000 mg | ORAL_TABLET | ORAL | Status: DC | PRN
Start: 2024-02-24 — End: 2024-02-25

## 2024-02-24 MED ORDER — ONDANSETRON HCL 4 MG/2ML IJ SOLN: 4.0000 mg | Freq: Four times a day (QID) | INTRAMUSCULAR | Status: DC | PRN

## 2024-02-24 MED ORDER — LACTATED RINGERS IV SOLN: INTRAVENOUS | Status: DC

## 2024-02-24 MED ORDER — SOD CITRATE-CITRIC ACID 500-334 MG/5ML PO SOLN: 30.0000 mL | ORAL | Status: DC | PRN

## 2024-02-24 MED ORDER — OXYCODONE-ACETAMINOPHEN 5-325 MG PO TABS: 1.0000 | ORAL_TABLET | ORAL | Status: DC | PRN

## 2024-02-24 MED ORDER — METFORMIN HCL 500 MG PO TABS
500.0000 mg | ORAL_TABLET | Freq: Two times a day (BID) | ORAL | Status: DC
  Filled 2024-02-24 (×4): qty 1

## 2024-02-24 MED ORDER — FENTANYL CITRATE (PF) 100 MCG/2ML IJ SOLN
50.0000 ug | INTRAMUSCULAR | Status: DC | PRN
  Administered 2024-02-24: 50 ug via INTRAVENOUS
  Administered 2024-02-25 (×3): 100 ug via INTRAVENOUS
  Filled 2024-02-24 (×4): qty 2

## 2024-02-24 MED ORDER — MISOPROSTOL 25 MCG QUARTER TABLET
25.0000 ug | ORAL_TABLET | ORAL | Status: DC | PRN
  Administered 2024-02-24 (×2): 25 ug via VAGINAL
  Filled 2024-02-24 (×3): qty 1

## 2024-02-24 MED ORDER — SODIUM CHLORIDE 0.9 % IV SOLN
5.0000 10*6.[IU] | Freq: Once | INTRAVENOUS | Status: AC
  Administered 2024-02-24: 5 10*6.[IU] via INTRAVENOUS
  Filled 2024-02-24: qty 5

## 2024-02-24 MED ORDER — DIPHENHYDRAMINE HCL 50 MG/ML IJ SOLN: 12.5000 mg | INTRAMUSCULAR | Status: DC | PRN

## 2024-02-24 MED ORDER — OXYCODONE-ACETAMINOPHEN 5-325 MG PO TABS: 2.0000 | ORAL_TABLET | ORAL | Status: DC | PRN

## 2024-02-24 MED ORDER — PENICILLIN G POT IN DEXTROSE 60000 UNIT/ML IV SOLN
3.0000 10*6.[IU] | INTRAVENOUS | Status: DC
  Administered 2024-02-24 – 2024-02-25 (×6): 3 10*6.[IU] via INTRAVENOUS
  Filled 2024-02-24 (×8): qty 50

## 2024-02-24 MED ORDER — PHENYLEPHRINE 80 MCG/ML (10ML) SYRINGE FOR IV PUSH (FOR BLOOD PRESSURE SUPPORT): 80.0000 ug | PREFILLED_SYRINGE | INTRAVENOUS | Status: DC | PRN

## 2024-02-24 MED ORDER — EPHEDRINE 5 MG/ML INJ: 10.0000 mg | INTRAVENOUS | Status: DC | PRN

## 2024-02-24 MED ORDER — LACTATED RINGERS IV SOLN: 500.0000 mL | INTRAVENOUS | Status: DC | PRN

## 2024-02-24 MED ORDER — OXYTOCIN-SODIUM CHLORIDE 30-0.9 UT/500ML-% IV SOLN
1.0000 m[IU]/min | INTRAVENOUS | Status: DC
  Administered 2024-02-25: 2 m[IU]/min via INTRAVENOUS
  Filled 2024-02-24 (×2): qty 500

## 2024-02-24 MED ORDER — TERBUTALINE SULFATE 1 MG/ML IJ SOLN: 0.2500 mg | Freq: Once | INTRAMUSCULAR | Status: DC | PRN

## 2024-02-24 MED ORDER — FLEET ENEMA RE ENEM: 1.0000 | ENEMA | RECTAL | Status: DC | PRN

## 2024-02-24 MED ORDER — LACTATED RINGERS IV SOLN: 500.0000 mL | Freq: Once | INTRAVENOUS | Status: DC

## 2024-02-24 MED ORDER — OXYTOCIN BOLUS FROM INFUSION
333.0000 mL | Freq: Once | INTRAVENOUS | Status: AC
  Administered 2024-02-25: 333 mL via INTRAVENOUS

## 2024-02-24 MED ORDER — MISOPROSTOL 25 MCG QUARTER TABLET
25.0000 ug | ORAL_TABLET | Freq: Once | ORAL | Status: AC
  Administered 2024-02-24: 25 ug via BUCCAL

## 2024-02-24 MED ORDER — FENTANYL-BUPIVACAINE-NACL 0.5-0.125-0.9 MG/250ML-% EP SOLN
12.0000 mL/h | EPIDURAL | Status: DC | PRN
  Administered 2024-02-25: 12 mL/h via EPIDURAL
  Filled 2024-02-24: qty 250

## 2024-02-24 MED ORDER — OXYTOCIN-SODIUM CHLORIDE 30-0.9 UT/500ML-% IV SOLN: 2.5000 [IU]/h | INTRAVENOUS | Status: DC

## 2024-02-24 NOTE — Progress Notes (Signed)
 Ashley Weber is a 28 y.o. G1P0000 at [redacted]w[redacted]d.  Subjective: Contractions getting uncomfortable. Coping well w/ comfort measures.   Objective: BP 112/72   Pulse 81   Temp 97.6 F (36.4 C) (Oral)   Resp 19   Ht 5' 8 (1.727 m)   Wt 113.7 kg   LMP 06/08/2023   BMI 38.10 kg/m    FHT:  FHR: 140 bpm, variability: mod,  accelerations:  15x15,  decelerations:  none UC:   Q 2-4 minutes, mild-mod Dilation: 1.5 Effacement (%): 50 Station: Ballotable Presentation: Vertex Exam by:: Sreeja Spies CNM Foley balloon placed w/out difficult.  Labs: Results for orders placed or performed during the hospital encounter of 02/24/24 (from the past 24 hours)  CBC     Status: Abnormal   Collection Time: 02/24/24  6:34 AM  Result Value Ref Range   WBC 7.4 4.0 - 10.5 K/uL   RBC 3.77 (L) 3.87 - 5.11 MIL/uL   Hemoglobin 9.8 (L) 12.0 - 15.0 g/dL   HCT 69.6 (L) 63.9 - 53.9 %   MCV 80.4 80.0 - 100.0 fL   MCH 26.0 26.0 - 34.0 pg   MCHC 32.3 30.0 - 36.0 g/dL   RDW 86.1 88.4 - 84.4 %   Platelets 240 150 - 400 K/uL   nRBC 0.0 0.0 - 0.2 %  RPR     Status: None   Collection Time: 02/24/24  6:34 AM  Result Value Ref Range   RPR Ser Ql NON REACTIVE NON REACTIVE  Type and screen [facility]     Status: None   Collection Time: 02/24/24  7:10 AM  Result Value Ref Range   ABO/RH(D) AB NEG    Antibody Screen POS    Sample Expiration 02/27/2024,2359    Antibody Identification      PASSIVELY ACQUIRED ANTI-D Performed at [facility], [address], [place], [place] [address]   Glucose, capillary     Status: None   Collection Time: 02/24/24  7:33 AM  Result Value Ref Range   Glucose-Capillary 92 70 - 99 mg/dL  Glucose, capillary     Status: None   Collection Time: 02/24/24 12:19 PM  Result Value Ref Range   Glucose-Capillary 74 70 - 99 mg/dL  Glucose, capillary     Status: None   Collection Time: 02/24/24  4:40 PM  Result Value Ref Range   Glucose-Capillary 80 70 - 99 mg/dL     Assessment / Plan: [redacted]w[redacted]d week IUP ROM x rupture date or rupture time have not been documented Labor: Early, cervix softer w/ cytotec . Foley now in place. Continue Cytotec . Switch to Buccal.  Fetal Wellbeing:  Category I Pain Control:  Comfort measures Anticipated MOD:  SVD A2GDM: CBG WNL on Metformin .   Claudene, Minah Axelrod , CNM 02/24/2024 8:02 PM

## 2024-02-24 NOTE — H&P (Signed)
 OBSTETRIC ADMISSION HISTORY AND PHYSICAL  Ashley Weber is a 28 y.o. female G1P0000 with IUP at [redacted]w[redacted]d by LMP presenting for IOL severe IUGR. She reports +FMs, No LOF, no VB, no blurry vision, headaches or peripheral edema, and RUQ pain.  She plans on both  feeding. She request OCPs for birth control. She received her prenatal care at [facility]   Dating: By LMP --->  Estimated Date of Delivery: 03/14/24  Sono:    @[redacted]w[redacted]d , CWD, normal anatomy, cephalic presentation, anterior placenta lie, 2046g, 3% EFW, AC 1.8%   Prenatal History/Complications:  Patient Active Problem List   Diagnosis Date Noted   Encounter for induction of labor 02/24/2024   Gestational diabetes, diet controlled 01/21/2024   Rh negative status during pregnancy 12/29/2023   Alpha thalassemia silent carrier 11/30/2023   Fetal growth restriction antepartum 11/30/2023   Supervision of other normal pregnancy, antepartum 09/29/2023   NURSING  PROVIDER  Office Location [facility] Dating by lmp  [facility] Model Centering Anatomy U/S IUGR, f/w MFM  Initiated care at  [employer] [facility]                 Language  English               LAB RESULTS   Support Person Davontae( FOB) Genetics NIPS: low risk AFP: normal      NT/IT (FT only)        Carrier Screen Horizon: silent alpha thal>partner+, per genetics no risk for disease in baby   Rhogam  AB/Negative/-- (02/18 1026) A1C/GTT Early HgbA1C: abnormal>normal 2hr GTT Third trimester 2 hr GTT:   Flu Vaccine Declined-09/29/23      TDaP Vaccine  12/31/23 Blood Type AB/Negative/-- (02/18 1026)  RSV Vaccine   Antibody Negative (02/18 1026)  COVID Vaccine 2- doses Rubella 6.04 (02/18 1026)  Feeding Plan both RPR Non Reactive (02/18 1026)  Contraception oral contraceptives (estrogen/progesterone) HBsAg Negative (02/18 1026)  Circumcision Yes HIV Non Reactive (02/18 1026)  Pediatrician  List given  HCVAb Non Reactive (02/18 1026)  Prenatal Classes        BTL Consent NA Pap       Diagnosis  Date  Value Ref Range Status  10/06/2023     Final    - Negative for intraepithelial lesion or malignancy (NILM)    BTL Pre-payment NA GC/CT Initial:  neg/neg 36wks:    VBAC Consent NA GBS For PCN allergy, check sensitivities   BRx Optimized? [ ]  yes   [ ]  no      DME Rx [ X] BP cuff [ ]  Weight Scale Waterbirth  [ ]  Class [ ]  Consent [ ]  CNM visit  PHQ9 & GAD7 [  ] new OB [  ] 28 weeks  [  ] 36 weeks Induction  [ ]  Orders Entered [ ] Foley Y/N     Past Medical History: Past Medical History:  Diagnosis Date   Asthma    Gestational diabetes     Past Surgical History: Past Surgical History:  Procedure Laterality Date   NO PAST SURGERIES      Obstetrical History: OB History     Gravida  1   Para  0   Term  0   Preterm  0   AB  0   Living  0      SAB  0   IAB  0   Ectopic  0   Multiple  0   Live Births  0  Social History Social History   Socioeconomic History   Marital status: Single    Spouse name: Not on file   Number of children: Not on file   Years of education: Not on file   Highest education level: Not on file  Occupational History   Not on file  Tobacco Use   Smoking status: Never   Smokeless tobacco: Never  Vaping Use   Vaping status: Former  Substance and Sexual Activity   Alcohol use: Not Currently   Drug use: Not Currently   Sexual activity: Yes    Birth control/protection: None  Other Topics Concern   Not on file  Social History Narrative   Not on file   Social Drivers of Health   Financial Resource Strain: Not on file  Food Insecurity: No Food Insecurity (10/06/2023)   Hunger Vital Sign    Worried About Running Out of Food in the Last Year: Never true    Ran Out of Food in the Last Year: Never true  Transportation Needs: No Transportation Needs (10/06/2023)   PRAPARE - Administrator, Civil Service (Medical): No    Lack of Transportation (Non-Medical): No  Physical Activity: Not on file  Stress: Not  on file  Social Connections: Not on file    Family History: Family History  Problem Relation Age of Onset   Diabetes Maternal Aunt    Cancer Maternal Aunt    Asthma Maternal Aunt    Diabetes Maternal Aunt    Cancer Maternal Aunt    Hypertension Maternal Grandmother    COPD Maternal Grandmother    Heart disease Maternal Grandmother    Diabetes Maternal Grandmother    Asthma Maternal Grandmother    Kidney disease Maternal Grandfather     Allergies: Allergies  Allergen Reactions   Peanut-Containing Drug Products Hives and Swelling    Medications Prior to Admission  Medication Sig Dispense Refill Last Dose/Taking   Accu-Chek Softclix Lancets lancets Use as instructed 100 each 12    folic acid (FOLVITE) 1 MG tablet Take 1 mg by mouth daily.      glucose blood (ACCU-CHEK GUIDE TEST) test strip Use as instructed 100 each 12    glucose blood test strip To check blood sugars 4 times a day. Fasting and 2 hours after the first bite of breakfast, lunch and dinner. 100 each 12    Lancets (ONETOUCH ULTRASOFT) lancets To check blood sugars 4 times a day. Fasting and 2 hours after the first bite of breakfast, lunch and dinner. 100 each 12    metFORMIN  (GLUCOPHAGE ) 500 MG tablet Take 1 tablet (500 mg total) by mouth 2 (two) times daily with a meal. 60 tablet 5    Prenatal Vit-Fe Fumarate-FA (PRENATAL MULTIVITAMIN) TABS tablet Take 1 tablet by mouth daily at 12 noon.        Review of Systems   All systems reviewed and negative except as stated in HPI  Height 5' 8 (1.727 m), weight 113.7 kg, last menstrual period 06/08/2023. General appearance: alert, cooperative, and appears stated age Lungs: clear to auscultation bilaterally Heart: regular rate and rhythm Abdomen: soft, non-tender; bowel sounds normal Pelvic: normal female genitalia Extremities: Homans sign is negative, no sign of DVT Presentation: cephalic Fetal monitoringBaseline: 130 bpm, Variability: Good {> 6 bpm),  Accelerations: Reactive, and Decelerations: Absent Uterine activityNone     Prenatal labs: ABO, Rh: AB/Negative/-- (02/18 1026) Antibody: Negative (05/15 0908) Rubella: 6.04 (02/18 1026) RPR: Non Reactive (05/15 0912)  HBsAg:  Negative (02/18 1026)  HIV: Non Reactive (05/15 0912)  GBS: Positive/-- (07/01 1248)    Lab Results  Component Value Date   GBS Positive (A) 02/16/2024   GTT GDM Genetic screening  LR, AFP nrl, Horizon silent alpha thal> partner +, per genetics no risk for disease in baby Anatomy US  IUGR  Immunization History  Administered Date(s) Administered   Dtap, Unspecified 08/16/1998, 10/09/1998, 12/13/1998, 09/09/1999, 07/19/2002   HIB, Unspecified 08/16/1998, 10/09/1998, 12/18/1999   HPV Quadrivalent 01/17/2011, 07/30/2011, 01/19/2012   Hep A, Unspecified 10/08/2005, 10/16/2006   Hep B, Unspecified 12/13/1998, 03/13/1999, 09/09/1999   IPV 08/16/1998, 10/09/1998, 06/12/1999, 07/19/2002   MMR 06/12/1999, 07/19/2002   Meningococcal B, Unspecified 03/04/2017, 04/29/2017   Meningococcal Mcv4,unspecified 01/17/2011, 02/01/2015   PFIZER Comirnaty(Gray Top)Covid-19 Tri-Sucrose Vaccine 10/25/2020   Pneumococcal Conjugate-13 09/09/1999, 12/18/1999   Tdap 11/14/2009, 09/03/2022, 12/31/2023   Varicella 06/12/1999, 12/13/2007    Prenatal Transfer Tool  Maternal Diabetes: Yes:  Diabetes Type:  Insulin/Medication controlled Genetic Screening: Normal Maternal Ultrasounds/Referrals: IUGR Fetal Ultrasounds or other Referrals:  Referred to Materal Fetal Medicine  Maternal Substance Abuse:  No Significant Maternal Medications:  None Significant Maternal Lab Results: Group B Strep positive and Rh negative Number of Prenatal Visits:greater than 3 verified prenatal visits Maternal Vaccinations:TDap Other Comments:  None   No results found for this or any previous visit (from the past 24 hours).  Patient Active Problem List   Diagnosis Date Noted   Gestational diabetes,  diet controlled 01/21/2024   Rh negative status during pregnancy 12/29/2023   Alpha thalassemia silent carrier 11/30/2023   Fetal growth restriction antepartum 11/30/2023   Supervision of other normal pregnancy, antepartum 09/29/2023    Assessment/Plan:  Megan Presti is a 28 y.o. G1P0000 at [redacted]w[redacted]d here for IOL severe IUGR  #Labor: Start Vcyto 25 #Pain: Per patient request #FWB: Cat 1 #GBS status:  Positive, PCN #Feeding: Breastmilk  and Formula #Reproductive Life planning: Combination OCPs  [name] , CWD, normal anatomy, cephalic presentation, anterior placenta lie, 2046g, 3% EFW, AC 1.8%, BPP 8/8, nrl dopplers  - caution with cytotec    #A2GDM: - CBGs q4hrs   #Rh neg:  - PP Rhogam evaluation  Augustin JAYSON Slade, MD  02/24/2024, 6:28 AM

## 2024-02-24 NOTE — Progress Notes (Signed)
 Labor Progress Note  Ashley Weber is a 28 y.o. G1P0000 at [redacted]w[redacted]d being induced for severe IUGR.   S: Doing well, no complaints.   O:  BP (!) 101/54   Pulse 74   Temp 97.6 F (36.4 C) (Oral)   Resp 19   Ht 5' 8 (1.727 m)   Wt 113.7 kg   LMP 06/08/2023   BMI 38.10 kg/m  EFM:125bpm/Moderate variability/ 15x15 accels/ None decels CAT: 1 Toco: irregular, every 4 minutes   CVE: Dilation: 1 Effacement (%): Thick Station: -3 Presentation: Vertex Exam by:: HILARIO Arena MD   A&P: 28 y.o. G1P0000 [redacted]w[redacted]d  here IOL for severe IUGR  #Labor: Will give another dose of cytotec  as her cervix could still use some ripening. Could consider placing a foley balloon at her next check, would be difficult now though as she is barely 1cm dilated. Will also not be too aggressive with her known severe IUGR.  #Pain: wants to see how she does #FWB: CAT 1 #GBS positive, continue PCN   [name] , CWD, normal anatomy, cephalic presentation, anterior placenta lie, 2046g, 3% EFW, AC 1.8%, BPP 8/8, nrl dopplers  - so far baby is tolerating the induction well    #A2GDM: - CBGs q4hrs, thus far have been well controlled   #Rh neg:  - PP Rhogam evaluation  Chiquita Clover, MD PGY-3 [facility] Family Medicine Resident  02/24/24  11:46 AM

## 2024-02-24 NOTE — Progress Notes (Signed)
 LABOR PROGRESS NOTE  Patient Name: Ashley Weber, female   DOB: 1995/09/08, 28 y.o.  MRN: [id number]  Cat 1 strip, 4 hrs from cytotec , FB still in.  Will start low dose pitocin , cap at 8U.   Augustin JAYSON Slade, MD

## 2024-02-25 ENCOUNTER — Other Ambulatory Visit

## 2024-02-25 ENCOUNTER — Inpatient Hospital Stay (HOSPITAL_COMMUNITY): Admitting: Anesthesiology

## 2024-02-25 ENCOUNTER — Encounter (HOSPITAL_COMMUNITY): Payer: Self-pay | Admitting: Obstetrics and Gynecology

## 2024-02-25 DIAGNOSIS — Z3A37 37 weeks gestation of pregnancy: Secondary | ICD-10-CM

## 2024-02-25 DIAGNOSIS — O99820 Streptococcus B carrier state complicating pregnancy: Secondary | ICD-10-CM | POA: Diagnosis not present

## 2024-02-25 DIAGNOSIS — O24420 Gestational diabetes mellitus in childbirth, diet controlled: Secondary | ICD-10-CM | POA: Diagnosis not present

## 2024-02-25 DIAGNOSIS — O365930 Maternal care for other known or suspected poor fetal growth, third trimester, not applicable or unspecified: Secondary | ICD-10-CM | POA: Diagnosis not present

## 2024-02-25 LAB — GLUCOSE, CAPILLARY: Glucose-Capillary: 88 mg/dL (ref 70–99)

## 2024-02-25 MED ORDER — TETANUS-DIPHTH-ACELL PERTUSSIS 5-2.5-18.5 LF-MCG/0.5 IM SUSY: 0.5000 mL | PREFILLED_SYRINGE | Freq: Once | INTRAMUSCULAR | Status: DC

## 2024-02-25 MED ORDER — COCONUT OIL OIL: 1.0000 | TOPICAL_OIL | Status: DC | PRN

## 2024-02-25 MED ORDER — LACTATED RINGERS AMNIOINFUSION: INTRAVENOUS | Status: DC

## 2024-02-25 MED ORDER — ONDANSETRON HCL 4 MG PO TABS: 4.0000 mg | ORAL_TABLET | ORAL | Status: DC | PRN

## 2024-02-25 MED ORDER — LIDOCAINE-EPINEPHRINE (PF) 1.5 %-1:200000 IJ SOLN
INTRAMUSCULAR | Status: DC | PRN
Start: 2024-02-25 — End: 2024-02-25
  Administered 2024-02-25 (×2): 3 mL via EPIDURAL

## 2024-02-25 MED ORDER — DIBUCAINE (PERIANAL) 1 % EX OINT: 1.0000 | TOPICAL_OINTMENT | CUTANEOUS | Status: DC | PRN

## 2024-02-25 MED ORDER — ACETAMINOPHEN 325 MG PO TABS: 650.0000 mg | ORAL_TABLET | ORAL | Status: DC | PRN

## 2024-02-25 MED ORDER — PRENATAL MULTIVITAMIN CH
1.0000 | ORAL_TABLET | Freq: Every day | ORAL | Status: DC
  Administered 2024-02-26 – 2024-02-27 (×2): 1 via ORAL
  Filled 2024-02-25 (×2): qty 1

## 2024-02-25 MED ORDER — RHO D IMMUNE GLOBULIN 1500 UNIT/2ML IJ SOSY
300.0000 ug | PREFILLED_SYRINGE | Freq: Once | INTRAMUSCULAR | Status: AC
  Administered 2024-02-26: 300 ug via INTRAVENOUS
  Filled 2024-02-25: qty 2

## 2024-02-25 MED ORDER — LIDOCAINE HCL (PF) 1 % IJ SOLN
INTRAMUSCULAR | Status: DC | PRN
  Administered 2024-02-25: 8 mL via EPIDURAL

## 2024-02-25 MED ORDER — WITCH HAZEL-GLYCERIN EX PADS: 1.0000 | MEDICATED_PAD | CUTANEOUS | Status: DC | PRN

## 2024-02-25 MED ORDER — BENZOCAINE-MENTHOL 20-0.5 % EX AERO
1.0000 | INHALATION_SPRAY | CUTANEOUS | Status: DC | PRN
  Administered 2024-02-27: 1 via TOPICAL
  Filled 2024-02-25: qty 56

## 2024-02-25 MED ORDER — ONDANSETRON HCL 4 MG/2ML IJ SOLN: 4.0000 mg | INTRAMUSCULAR | Status: DC | PRN

## 2024-02-25 MED ORDER — SIMETHICONE 80 MG PO CHEW: 80.0000 mg | CHEWABLE_TABLET | ORAL | Status: DC | PRN

## 2024-02-25 MED ORDER — SENNOSIDES-DOCUSATE SODIUM 8.6-50 MG PO TABS
2.0000 | ORAL_TABLET | Freq: Every day | ORAL | Status: DC
  Administered 2024-02-26: 2 via ORAL
  Filled 2024-02-25: qty 2

## 2024-02-25 MED ORDER — DIPHENHYDRAMINE HCL 25 MG PO CAPS
25.0000 mg | ORAL_CAPSULE | Freq: Four times a day (QID) | ORAL | Status: DC | PRN
  Administered 2024-02-25: 25 mg via ORAL
  Filled 2024-02-25: qty 1

## 2024-02-25 MED ORDER — IBUPROFEN 600 MG PO TABS
600.0000 mg | ORAL_TABLET | Freq: Four times a day (QID) | ORAL | Status: DC
  Administered 2024-02-25 – 2024-02-27 (×8): 600 mg via ORAL
  Filled 2024-02-25 (×8): qty 1

## 2024-02-25 NOTE — Anesthesia Procedure Notes (Addendum)
 Epidural Patient location during procedure: OB Start time: 02/25/2024 7:43 AM End time: 02/25/2024 7:56 AM  Staffing Anesthesiologist: Merla Almarie HERO, DO Performed: anesthesiologist   Preanesthetic Checklist Completed: patient identified, IV checked, risks and benefits discussed, monitors and equipment checked, pre-op evaluation and timeout performed  Epidural Patient position: sitting Prep: DuraPrep and site prepped and draped Patient monitoring: continuous pulse ox, blood pressure, heart rate and cardiac monitor Approach: midline Location: L3-L4 Injection technique: LOR air  Needle:  Needle type: Tuohy  Needle gauge: 17 G Needle length: 9 cm Needle insertion depth: 7 cm Catheter type: closed end flexible Catheter size: 19 Gauge Catheter at skin depth: 12 cm Test dose: negative  Assessment Sensory level: T8 Events: blood aspirated, no cerebrospinal fluid, injection not painful, no injection resistance, no paresthesia and positive IV test  Additional Notes Patient identified. Risks/Benefits/Options discussed with patient including but not limited to bleeding, infection, nerve damage, paralysis, failed block, incomplete pain control, headache, blood pressure changes, nausea, vomiting, reactions to medication both or allergic, itching and postpartum back pain. Confirmed with bedside nurse the patient's most recent platelet count. Confirmed with patient that they are not currently taking any anticoagulation, have any bleeding history or any family history of bleeding disorders. Patient expressed understanding and wished to proceed. All questions were answered. Sterile technique was used throughout the entire procedure. Please see nursing notes for vital signs. Test dose was given through epidural catheter and negative prior to continuing to dose epidural or start infusion. Warning signs of high block given to the patient including shortness of breath, tingling/numbness in hands,  complete motor block, or any concerning symptoms with instructions to call for help. Patient was given instructions on fall risk and not to get out of bed. All questions and concerns addressed with instructions to call with any issues or inadequate analgesia.  1st catheter + heme, positive test dose. 2nd catheter at same level negative heme, neg test dose.   Reason for block:procedure for pain

## 2024-02-25 NOTE — Lactation Note (Addendum)
 This note was copied from a baby's chart.  Lactation Consultation Note  Patient Name: Ashley Weber Unijb'd Date: 02/25/2024 Age:26 hours Reason for consult: Initial assessment;Primapara;1st time breastfeeding;Early term 37-38.6wks;Infant < 6lbs;Maternal endocrine disorder;Breastfeeding assistance  P1- Infant was born at [redacted]w[redacted]d weighing 2380g. MOB's RN had already set up the hospital DEBP and provided the new parents with the LPI feeding guidelines. Infant last fed 2.5 hrs prior, so LC encouraged a feeding session. MOB was in agreement. First LC reviewed the LPI/low birth weight feeding guidelines.  Feeding as follows: -Entire feeding to last 30 minutes or less. -Pump for 15 minutes after every breastfeeding. -Supplementation for day 1 is a minimum of 11-12 mL, day 2 is 16-18 mL and day 3 is 22-24 mL.  -Use the white Nfant nipple unless SLP recommends something else.  Infant was placed on the left breast in the football hold. Infant was eager and latched immediatly with little assistance. Infant had flanged lips with a strong rhythmic suck. Infant nursed for 15 minutes with no stimulation needed to sustain sucking motion. LC then demonstrated how to pace feed infant in a side lying position with the white Nfant nipple. Infant tolerated the feeding very well. After 14 mLs infant became fatigued and fell asleep in FOB's arms. MOB requested to eat her dinner before pumping so LC was unable to assess volume. LC reviewed the first 24 hr birthday nap, day 2 cluster feeding, feeding infant on cue 8-12x in 24 hrs, not allowing infant to go over 3 hrs without a feeding, CDC milk storage guidelines, LC services handout and engorgement/breast care. LC encouraged MOB to call for further assistance as needed. Referral for STORK pump sent today.  Maternal Data Has patient been taught Hand Expression?: Yes Does the patient have breastfeeding experience prior to this delivery?: No  Feeding Mother's Current  Feeding Choice: Breast Milk and Formula Nipple Type: Nfant Standard Flow (white)  LATCH Score Latch: Grasps breast easily, tongue down, lips flanged, rhythmical sucking.  Audible Swallowing: A few with stimulation  Type of Nipple: Everted at rest and after stimulation  Comfort (Breast/Nipple): Soft / non-tender  Hold (Positioning): Assistance needed to correctly position infant at breast and maintain latch.  LATCH Score: 8   Lactation Tools Discussed/Used Tools: Pump;Flanges Flange Size: 24 Breast pump type: Double-Electric Breast Pump;Manual Pump Education: Setup, frequency, and cleaning;Milk Storage Reason for Pumping: LPI/low birth weight Pumping frequency: 15-20 min every 3 hrs  Interventions Interventions: Breast feeding basics reviewed;Assisted with latch;Hand express;Breast compression;Adjust position;Support pillows;Position options;Hand pump;DEBP;Education;Pace feeding;LC Services brochure;LPT handout/interventions  Discharge Discharge Education: Engorgement and breast care;Warning signs for feeding baby  Consult Status Consult Status: Follow-up Date: 02/26/24 Follow-up type: In-patient    Recardo Hoit BS, IBCLC 02/25/2024, 7:25 PM

## 2024-02-25 NOTE — Progress Notes (Signed)
 LABOR PROGRESS NOTE  Patient Name: Ashley Weber, female   DOB: Apr 21, 1996, 28 y.o.  MRN: [id number]  Cat 2 strip with some variables, contracting q3 and regular. AROM 0157 with clear fluid on 10 of pit, continue to titrate prn CVE: 6/80/-2 IUPC placed to start amnioinfusion with the variables.  Continue to monitor, recheck in 2 hours   .   Chiquita Clover, MD

## 2024-02-25 NOTE — Anesthesia Preprocedure Evaluation (Signed)
 Anesthesia Evaluation  Patient identified by MRN, date of birth, ID band Patient awake    Reviewed: Allergy & Precautions, Patient's Chart, lab work & pertinent test results  Airway Mallampati: III  TM Distance: >3 FB Neck ROM: Full    Dental no notable dental hx.    Pulmonary asthma    Pulmonary exam normal breath sounds clear to auscultation       Cardiovascular negative cardio ROS Normal cardiovascular exam Rhythm:Regular Rate:Normal     Neuro/Psych negative neurological ROS  negative psych ROS   GI/Hepatic negative GI ROS, Neg liver ROS,,,  Endo/Other  diabetes, Gestational  BMI 38  Renal/GU negative Renal ROS  negative genitourinary   Musculoskeletal negative musculoskeletal ROS (+)    Abdominal  (+) + obese  Peds negative pediatric ROS (+)  Hematology  (+) Blood dyscrasia, anemia Hb 9.8, plt 240   Anesthesia Other Findings   Reproductive/Obstetrics (+) Pregnancy                              Anesthesia Physical Anesthesia Plan  ASA: 2  Anesthesia Plan: Epidural   Post-op Pain Management:    Induction:   PONV Risk Score and Plan: 2  Airway Management Planned: Natural Airway  Additional Equipment: None  Intra-op Plan:   Post-operative Plan:   Informed Consent: I have reviewed the patients History and Physical, chart, labs and discussed the procedure including the risks, benefits and alternatives for the proposed anesthesia with the patient or authorized representative who has indicated his/her understanding and acceptance.       Plan Discussed with:   Anesthesia Plan Comments:         Anesthesia Quick Evaluation

## 2024-02-25 NOTE — Progress Notes (Signed)
 LABOR PROGRESS NOTE  Patient Name: Ashley Weber, female   DOB: 1996-05-22, 28 y.o.  MRN: [id number]  Cat 1 strip, agreeable to SVE and ?AROM.  Able to AROM, clear fluid.  Mom and baby tolerated well.  Continue pit titration PRN.   Augustin JAYSON Slade, MD

## 2024-02-25 NOTE — Discharge Summary (Signed)
 Postpartum Discharge Summary     Patient Name: Ashley Weber DOB: Nov 20, 1995 MRN: [id number]  Date of admission: 02/24/2024 Delivery date:02/25/2024 Delivering provider: REGINO CREDIT A Date of discharge: 02/27/2024  Admitting diagnosis: Encounter for induction of labor [Z34.90] Intrauterine pregnancy: [redacted]w[redacted]d     Secondary diagnosis:  Active Problems:   Fetal growth restriction antepartum   Rh negative status during pregnancy   Gestational diabetes, diet controlled   Encounter for induction of labor  Additional problems: NA    Discharge diagnosis: Term Pregnancy Delivered and GDM A2                                              Post partum procedures:NA Augmentation: AROM, Cytotec , and IP Foley Complications: None  Hospital course: Induction of Labor With Vaginal Delivery   28 y.o. yo G1P1001 at [redacted]w[redacted]d was admitted to the hospital 02/24/2024 for induction of labor.  Indication for induction: A2 DM and IUGR.  Patient had an labor course complicated by none Membrane Rupture Time/Date: 1:57 AM,02/25/2024  Delivery Method:Vaginal, Spontaneous Operative Delivery:N/A Episiotomy: None Lacerations:  1st degree;Vaginal Details of delivery can be found in separate delivery note.  Patient had a postpartum course complicated by none. Patient is discharged home 02/27/24.  Newborn Data: Birth date:02/25/2024 Birth time:12:55 PM Gender:Female Living status:Living Apgars:9 ,9  Weight:2380 g  Magnesium Sulfate received: No BMZ received: No Rhophylac :Yes MMR:N/A T-DaP:Given prenatally Flu: N/A RSV Vaccine received: No Transfusion:No  Immunizations received: Immunization History  Administered Date(s) Administered   Dtap, Unspecified 08/16/1998, 10/09/1998, 12/13/1998, 09/09/1999, 07/19/2002   HIB, Unspecified 08/16/1998, 10/09/1998, 12/18/1999   HPV Quadrivalent 01/17/2011, 07/30/2011, 01/19/2012   Hep A, Unspecified 10/08/2005, 10/16/2006   Hep B, Unspecified 12/13/1998,  03/13/1999, 09/09/1999   IPV 08/16/1998, 10/09/1998, 06/12/1999, 07/19/2002   MMR 06/12/1999, 07/19/2002   Meningococcal B, Unspecified 03/04/2017, 04/29/2017   Meningococcal Mcv4,unspecified 01/17/2011, 02/01/2015   PFIZER Comirnaty(Gray Top)Covid-19 Tri-Sucrose Vaccine 10/25/2020   Pneumococcal Conjugate-13 09/09/1999, 12/18/1999   Tdap 11/14/2009, 09/03/2022, 12/31/2023   Varicella 06/12/1999, 12/13/2007    Physical exam  Vitals:   02/26/24 2004 02/26/24 2110 02/26/24 2200 02/27/24 0559  BP: 139/88 136/82 125/64 127/89  Pulse: 70 69  (!) 57  Resp: 18   17  Temp: 98.1 F (36.7 C)   98.2 F (36.8 C)  TempSrc: Oral   Oral  SpO2: 100% 100%  100%  Weight:      Height:       General: alert, cooperative, and no distress Lochia: appropriate Uterine Fundus: firm Incision: N/A DVT Evaluation: No evidence of DVT seen on physical exam. Labs: Lab Results  Component Value Date   WBC 10.0 02/26/2024   HGB 9.1 (L) 02/26/2024   HCT 28.9 (L) 02/26/2024   MCV 81.9 02/26/2024   PLT 212 02/26/2024      Latest Ref Rng & Units 02/27/2024    4:26 AM  CMP  Glucose 70 - 99 mg/dL 89    Edinburgh Score:    02/26/2024    3:43 AM  Edinburgh Postnatal Depression Scale Screening Tool  I have been able to laugh and see the funny side of things. 0  I have looked forward with enjoyment to things. 0  I have blamed myself unnecessarily when things went wrong. 1  I have been anxious or worried for no good reason. 2  I have felt scared  or panicky for no good reason. 1  Things have been getting on top of me. 1  I have been so unhappy that I have had difficulty sleeping. 0  I have felt sad or miserable. 0  I have been so unhappy that I have been crying. 1  The thought of harming myself has occurred to me. 0  Edinburgh Postnatal Depression Scale Total 6   Edinburgh Postnatal Depression Scale Total: 6   After visit meds:  Allergies as of 02/27/2024       Reactions   Peanut-containing Drug  Products Hives, Swelling        Medication List     STOP taking these medications    Accu-Chek Guide Test test strip Generic drug: glucose blood   Accu-Chek Softclix Lancets lancets   folic acid 1 MG tablet Commonly known as: FOLVITE   glucose blood test strip   metFORMIN  500 MG tablet Commonly known as: GLUCOPHAGE    onetouch ultrasoft lancets       TAKE these medications    A.E.R. Witch Hazel pad Generic drug: witch hazel-glycerin  Apply 1 Application topically as needed for hemorrhoids.   acetaminophen  325 MG tablet Commonly known as: Tylenol  Take 2 tablets (650 mg total) by mouth every 4 (four) hours as needed (for pain scale < 4).   benzocaine -Menthol  20-0.5 % Aero Commonly known as: DERMOPLAST Apply 1 Application topically as needed for irritation (perineal discomfort).   ibuprofen  600 MG tablet Commonly known as: ADVIL  Take 1 tablet (600 mg total) by mouth every 6 (six) hours.   prenatal multivitamin Tabs tablet Take 1 tablet by mouth daily at 12 noon.   Senna-S 8.6-50 MG tablet Generic drug: senna-docusate Take 2 tablets by mouth daily.         Discharge home in stable condition Infant Feeding: Bottle and Breast Infant Disposition:home with mother Discharge instruction: per After Visit Summary and Postpartum booklet. Activity: Advance as tolerated. Pelvic rest for 6 weeks.  Diet: routine diet Future Appointments: Future Appointments  Date Time Provider Department Center  03/29/2024  8:50 AM [facility]  03/29/2024  9:55 AM Nicholaus Burnard HERO, MD [facility]   Follow up Visit:   Please schedule this patient for a In person postpartum visit in 6 weeks with the following provider: Any provider.  Additional Postpartum F/U:2 hour GTT  High risk pregnancy complicated by: GDM and IUGR Delivery mode:  Vaginal, Spontaneous Anticipated Birth Control:  OCPs   02/27/2024 Camie DELENA Rote, CNM

## 2024-02-26 ENCOUNTER — Encounter (HOSPITAL_COMMUNITY): Payer: Self-pay | Admitting: Obstetrics and Gynecology

## 2024-02-26 ENCOUNTER — Other Ambulatory Visit: Payer: Self-pay

## 2024-02-26 LAB — CBC
HCT: 28.9 % — ABNORMAL LOW (ref 36.0–46.0)
Hemoglobin: 9.1 g/dL — ABNORMAL LOW (ref 12.0–15.0)
MCH: 25.8 pg — ABNORMAL LOW (ref 26.0–34.0)
MCHC: 31.5 g/dL (ref 30.0–36.0)
MCV: 81.9 fL (ref 80.0–100.0)
Platelets: 212 K/uL (ref 150–400)
RBC: 3.53 MIL/uL — ABNORMAL LOW (ref 3.87–5.11)
RDW: 14.1 % (ref 11.5–15.5)
WBC: 10 K/uL (ref 4.0–10.5)
nRBC: 0 % (ref 0.0–0.2)

## 2024-02-26 MED ORDER — MEDROXYPROGESTERONE ACETATE 150 MG/ML IM SUSP
150.0000 mg | Freq: Once | INTRAMUSCULAR | Status: AC
  Administered 2024-02-26: 150 mg via INTRAMUSCULAR
  Filled 2024-02-26: qty 1

## 2024-02-26 NOTE — Anesthesia Postprocedure Evaluation (Signed)
 Anesthesia Post Note  Patient: Jhoana Upham  Procedure(s) Performed: AN AD HOC LABOR EPIDURAL     Patient location during evaluation: Mother Baby Anesthesia Type: Epidural Level of consciousness: awake Pain management: satisfactory to patient Vital Signs Assessment: post-procedure vital signs reviewed and stable Respiratory status: spontaneous breathing Cardiovascular status: stable Anesthetic complications: no   No notable events documented.  Last Vitals:  Vitals:   02/25/24 2351 02/26/24 0342  BP: 125/69 117/69  Pulse: 73 83  Resp: 18 18  Temp: 37 C 36.7 C  SpO2: 95% 99%    Last Pain:  Vitals:   02/26/24 0800  TempSrc:   PainSc: 0-No pain   Pain Goal: Patients Stated Pain Goal: 0 (02/25/24 1704)                 JEANENNE HANDING

## 2024-02-26 NOTE — Progress Notes (Signed)
 POSTPARTUM PROGRESS NOTE  Post Partum Day 1 Subjective:  Ashley Weber is a 28 y.o. G1P1001 [redacted]w[redacted]d s/p nsvd.  No acute events overnight.  Pt denies problems with ambulating, voiding or po intake.  She denies nausea or vomiting.  Pain is well controlled.  She has had flatus. She has not had bowel movement.  Lochia Small.   Objective: Blood pressure 117/69, pulse 83, temperature 98 F (36.7 C), temperature source Oral, resp. rate 18, height 5' 8 (1.727 m), weight 113.7 kg, last menstrual period 06/08/2023, SpO2 99%, unknown if currently breastfeeding.  Physical Exam:  General: alert, cooperative and no distress Lochia:normal flow Chest: CTAB Heart: RRR no m/r/g Abdomen: +BS, soft, nontender,  Uterine Fundus: firm,  DVT Evaluation: No calf swelling or tenderness Extremities: no edema  Recent Labs    02/24/24 0634 02/26/24 0534  HGB 9.8* 9.1*  HCT 30.3* 28.9*    Assessment/Plan:  ASSESSMENT: Ashley Weber is a 28 y.o. G1P1001 [redacted]w[redacted]d s/p nsvd, doing well. Will check fasting sugar tomorrow. Lochia appropriate. Rh positive baby, rhogam ordered. Wants depo here, ocps as outpt. Breast and bottle feeding.   Plan for discharge tomorrow   LOS: 2 days   Devaughn KATHEE Ban 02/26/2024, 12:15 PM

## 2024-02-26 NOTE — Patient Instructions (Signed)

## 2024-02-26 NOTE — Progress Notes (Signed)
 POSTPARTUM PROGRESS NOTE  Post Partum Day 1  Subjective:  Ashley Weber is a 28 y.o. G1P1001 s/p SVD at [redacted]w[redacted]d.  No acute events overnight.  Pt denies problems with ambulating, voiding or po intake.  She denies nausea or vomiting.  Pain is well controlled.  She has not had flatus. She has not had bowel movement.  Lochia Small.   Objective: Blood pressure 117/69, pulse 83, temperature 98 F (36.7 C), temperature source Oral, resp. rate 18, height 5' 8 (1.727 m), weight 113.7 kg, last menstrual period 06/08/2023, SpO2 99%, unknown if currently breastfeeding.  Physical Exam:  General: alert, cooperative and no distress Chest: no respiratory distress Heart:regular rate, distal pulses intact Abdomen: soft, nontender,  Uterine Fundus: firm, appropriately tender DVT Evaluation: No calf swelling or tenderness Extremities:  edema   Recent Labs    02/24/24 0634 02/26/24 0534  HGB 9.8* 9.1*  HCT 30.3* 28.9*    Assessment/Plan: Ashley Weber is a 28 y.o. G1P1001 PPD 1 s/p NSVD at [redacted]w[redacted]d   PPD#1 - Doing well  #rh neg: pp rhogam ordered   #A2GDM: - on metformin  during pre, has been discontinued - needs 2 week GTT OP  Contraception: Depo, ordered -> OCPs Feeding: breast and bottle Dispo: Plan for discharge likely tomorrow, but can be discharged today if she wants to.   LOS: 2 days   Ashley Clover, MD PGY3 02/26/2024, 9:11 AM

## 2024-02-26 NOTE — Plan of Care (Signed)

## 2024-02-26 NOTE — Lactation Note (Signed)
 This note was copied from a baby's chart. Lactation Consultation Note  Patient Name: Ashley Weber Date: 02/26/2024 Age:26 hours Reason for consult: Follow-up assessment;Primapara;1st time breastfeeding;Early term 37-38.6wks;Infant < 6lbs;Maternal endocrine disorder  P1- Infant was born at [redacted]w[redacted]d GA weighing 2380g and has had a total weight loss of 3.57%. Per MOB, Infant has not latched since LC last assisted them yesterday. MOB also reports that infant has been having poor feedings with the formula as well. MOB has been pumping some and with the last pump, she was able to collect 3 mL. LC praised MOB for her EBM volume. LC encouraged MOB to pump more frequently and more consistently since infant is not nursing. LC reviewed how infant is now past 24 hrs, so she may be more willing to nurse tonight. Per MOB, infant had just fed so she did not want to latch. LC encouraged MOB to call out for feeding assistance tonight with both breast and the formula.  Maternal Data Has patient been taught Hand Expression?: Yes Does the patient have breastfeeding experience prior to this delivery?: No  Feeding Mother's Current Feeding Choice: Breast Milk and Formula Nipple Type: Nfant Standard Flow (white)  Lactation Tools Discussed/Used Tools: Pump;Flanges Flange Size: 24 Breast pump type: Double-Electric Breast Pump;Manual Pump Education: Setup, frequency, and cleaning;Milk Storage Reason for Pumping: LPI/low  birth weight infant Pumping frequency: 15-20 mine every 3 hrs Pumped volume: 3 mL  Interventions Interventions: Breast feeding basics reviewed;Hand pump;DEBP;Education;Pace feeding;LC Services brochure;LPT handout/interventions  Discharge Discharge Education: Engorgement and breast care;Warning signs for feeding baby Pump: Advised to call insurance company  Consult Status Consult Status: Follow-up Date: 02/27/24 Follow-up type: In-patient    Recardo Hoit BS, IBCLC 02/26/2024,  6:16 PM

## 2024-02-26 NOTE — Lactation Note (Signed)
 This note was copied from a baby's chart. Lactation Consultation Note  Patient Name: Girl Lexani Corona Unijb'd Date: 02/26/2024 Age:26 hours   Pump:  (per [facility] the mom will have to go through her own insurance does not qualify for [facility] pump)  Consult Status Consult Status: Follow-up Date: 02/26/24 Follow-up type: In-patient    Rollene Caldron Geroge Gilliam 02/26/2024, 12:33 PM

## 2024-02-26 NOTE — Progress Notes (Signed)
 POSTPARTUM PROGRESS NOTE  Post Partum Day 1  Subjective:  Ashley Weber is a 28 y.o. G1P1001 s/p SVD at [redacted]w[redacted]d.  No acute events overnight.  Pt denies problems with ambulating, voiding or po intake.  She denies nausea or vomiting.  Pain is well controlled.  She has not had flatus. She has not had bowel movement.  Lochia Small.   Objective: Blood pressure 117/69, pulse 83, temperature 98 F (36.7 C), temperature source Oral, resp. rate 18, height 5' 8 (1.727 m), weight 113.7 kg, last menstrual period 06/08/2023, SpO2 99%, unknown if currently breastfeeding.  Physical Exam:  General: alert, cooperative and no distress Chest: no respiratory distress Heart:regular rate, distal pulses intact Abdomen: soft, nontender,  Uterine Fundus: firm, appropriately tender DVT Evaluation: No calf swelling or tenderness Extremities:  edema   Recent Labs    02/24/24 0634 02/26/24 0534  HGB 9.8* 9.1*  HCT 30.3* 28.9*    Assessment/Plan: Ashley Weber is a 28 y.o. G1P1001 PPD 1 s/p NSVD at [redacted]w[redacted]d   PPD#1 - Doing well Contraception: Depo, ordered Feeding: breast and bottle Dispo: Plan for discharge likely tomorrow, but can be discharged today if she wants to.   LOS: 2 days   Chiquita Clover, MD PGY3 02/26/2024, 9:08 AM

## 2024-02-27 ENCOUNTER — Other Ambulatory Visit (HOSPITAL_COMMUNITY): Payer: Self-pay

## 2024-02-27 LAB — RH IG WORKUP (INCLUDES ABO/RH)
Fetal Screen: NEGATIVE
Gestational Age(Wks): 37.3
Unit division: 0

## 2024-02-27 LAB — GLUCOSE, RANDOM: Glucose, Bld: 89 mg/dL (ref 70–99)

## 2024-02-27 MED ORDER — ACETAMINOPHEN 325 MG PO TABS
650.0000 mg | ORAL_TABLET | ORAL | 0 refills | Status: AC | PRN
  Filled 2024-02-27: qty 30, 3d supply, fill #0

## 2024-02-27 MED ORDER — SENNOSIDES-DOCUSATE SODIUM 8.6-50 MG PO TABS
2.0000 | ORAL_TABLET | Freq: Every day | ORAL | 0 refills | Status: AC
  Filled 2024-02-27: qty 30, 15d supply, fill #0

## 2024-02-27 MED ORDER — BENZOCAINE-MENTHOL 20-0.5 % EX AERO
1.0000 | INHALATION_SPRAY | CUTANEOUS | 0 refills | Status: AC | PRN
  Filled 2024-02-27: qty 56, fill #0

## 2024-02-27 MED ORDER — WITCH HAZEL-GLYCERIN EX PADS
1.0000 | MEDICATED_PAD | CUTANEOUS | 12 refills | Status: AC | PRN
  Filled 2024-02-27: qty 40, 40d supply, fill #0

## 2024-02-27 MED ORDER — SENNOSIDES-DOCUSATE SODIUM 8.6-50 MG PO TABS: 2.0000 | ORAL_TABLET | Freq: Every day | ORAL | 0 refills | Status: DC

## 2024-02-27 MED ORDER — BENZOCAINE-MENTHOL 20-0.5 % EX AERO: 1.0000 | INHALATION_SPRAY | CUTANEOUS | 0 refills | Status: DC | PRN

## 2024-02-27 MED ORDER — IBUPROFEN 600 MG PO TABS: 600.0000 mg | ORAL_TABLET | Freq: Four times a day (QID) | ORAL | 0 refills | Status: DC

## 2024-02-27 MED ORDER — IBUPROFEN 600 MG PO TABS
600.0000 mg | ORAL_TABLET | Freq: Four times a day (QID) | ORAL | 0 refills | Status: AC
  Filled 2024-02-27: qty 30, 8d supply, fill #0

## 2024-02-27 MED ORDER — ACETAMINOPHEN 325 MG PO TABS: 650.0000 mg | ORAL_TABLET | ORAL | 0 refills | Status: DC | PRN

## 2024-02-27 MED ORDER — WITCH HAZEL-GLYCERIN EX PADS: 1.0000 | MEDICATED_PAD | CUTANEOUS | 12 refills | Status: DC | PRN

## 2024-02-27 NOTE — Lactation Note (Addendum)
 This note was copied from a baby's chart. Lactation Consultation Note  Patient Name: Ashley Weber Date: 02/27/2024 Age:26 hours Reason for consult: Follow-up assessment;1st time breastfeeding;Early term 37-38.6wks  P1, Baby [redacted]w[redacted]d.  Baby breastfed for 20 min before LC entered room with the assistance of Gaffer. Mother giving baby a bottle of 22 kcal formula with Nfant standard slow flow nipple.  Baby consumed 10 ml. Mother plans to continue to offer the breast first before formula. Mother has not pumped since last night.  Discussed if not offering the breasts, she will need to pump to establish her milk supply. Provided information to mother to call for a DEBP from her insurance and reviewed hand pump use, engorgement care and monitoring voids and stools. Discussed frequency of feeding and duration. Provided mother with a curved tip syringe and demonstrated how to use with drops of breastmilk in addition to spoon feeding until volume is enough to put in a bottle. Mother had her own syringe for drops she has been using. Good to hear, mother has [facility] appointment on Monday.  Discussed taking her pump parts with her when discharged today so she can use with WIC pump.    Maternal Data Has patient been taught Hand Expression?: Yes Does the patient have breastfeeding experience prior to this delivery?: No  Feeding Mother's Current Feeding Choice: Breast Milk and Formula  LATCH Score Latch: Too sleepy or reluctant, no latch achieved, no sucking elicited.  Audible Swallowing: None  Type of Nipple: Everted at rest and after stimulation  Comfort (Breast/Nipple): Soft / non-tender  Hold (Positioning): Assistance needed to correctly position infant at breast and maintain latch.  LATCH Score: 5   Lactation Tools Discussed/Used  DEBP  Interventions Interventions: Breast feeding basics reviewed;DEBP;Hand pump;Education  Discharge Discharge Education: Engorgement and breast  care;Warning signs for feeding baby Pump: Manual;Advised to call insurance company (Mother has [facility] appointment on Monday)  Consult Status Consult Status: Complete Date: 02/27/24   Shannon Levorn Lemme  RN, IBCLC 02/27/2024, 8:56 AM

## 2024-02-28 NOTE — Lactation Note (Signed)
 This note was copied from a baby's chart. Lactation Consultation Note  Patient Name: Ashley Weber Unijb'd Date: 02/28/2024 Age:26 hours Reason for consult: Follow-up assessment;1st time breastfeeding;Early term 37-38.6wks;Infant < 6lbs;Maternal endocrine disorder  P1,  Baby [redacted]w[redacted]d. Visitors in room. Baby has been formula feeding with Dr. Orlinda Preemie bottle.  Mother did pump  10 ml last night. She feels her breasts are filling. Reminded mother to pump q 3 hours for 15 min.  Reviewed engorgement care and monitoring voids/stools. Suggest calling if mother needs help with pumping.   Maternal Data Has patient been taught Hand Expression?: Yes  Feeding Mother's Current Feeding Choice: Breast Milk and Formula Nipple Type: Dr. Jonna Galli  Lactation Tools Discussed/Used Tools: Pump;Flanges Flange Size: 24 Breast pump type: Double-Electric Breast Pump;Manual Reason for Pumping: stimulation and supplementation Pumping frequency:  (Recommend q  3 hours for 15 min) Pumped volume: 10 mL  Interventions Interventions: DEBP;Education  Discharge Discharge Education: Engorgement and breast care;Warning signs for feeding baby Pump: Advised to call insurance company  Consult Status Consult Status: Complete    Shannon Levorn Lemme 02/28/2024, 9:17 AM

## 2024-02-29 LAB — SURGICAL PATHOLOGY

## 2024-03-03 ENCOUNTER — Other Ambulatory Visit

## 2024-03-08 ENCOUNTER — Telehealth (HOSPITAL_COMMUNITY): Payer: Self-pay | Admitting: *Deleted

## 2024-03-08 NOTE — Telephone Encounter (Signed)
 03/08/2024  Name: Ashley Weber MRN: [id number] DOB: Nov 21, 1995  Reason for Call:  Transition of Care Hospital Discharge Call  Contact Status: Patient Contact Status: Complete  Language assistant needed:          Follow-Up Questions: Do You Have Any Concerns About Your Health As You Heal From Delivery?: No Do You Have Any Concerns About Your Infants Health?: Yes What Concerns Do You Have About Your Baby?: Patient asked about infant's healing umbilicus. RN reviewed warning signs to report to pediatrician. No problems reported at this time. Patient voiced no other questions or concerns at this time.  Edinburgh Postnatal Depression Scale:  In the Past 7 Days: I have been able to laugh and see the funny side of things.: As much as I always could I have looked forward with enjoyment to things.: As much as I ever did I have blamed myself unnecessarily when things went wrong.: Not very often I have been anxious or worried for no good reason.: No, not at all I have felt scared or panicky for no good reason.: No, not at all Things have been getting on top of me.: No, most of the time I have coped quite well I have been so unhappy that I have had difficulty sleeping.: Not at all I have felt sad or miserable.: No, not at all I have been so unhappy that I have been crying.: No, never The thought of harming myself has occurred to me.: Never [place] Postnatal Depression Scale Total: 2  PHQ2-9 Depression Scale:     Discharge Follow-up: Edinburgh score requires follow up?: No Patient was advised of the following resources:: Breastfeeding Support Group, Support Group  Post-discharge interventions: Reviewed Newborn Safe Sleep Practices  Signature Allean IVAR Carton, RN, 03/08/24, [phone]

## 2024-03-22 ENCOUNTER — Other Ambulatory Visit: Payer: Self-pay

## 2024-03-22 DIAGNOSIS — O24415 Gestational diabetes mellitus in pregnancy, controlled by oral hypoglycemic drugs: Secondary | ICD-10-CM

## 2024-03-29 ENCOUNTER — Other Ambulatory Visit

## 2024-03-29 ENCOUNTER — Ambulatory Visit: Admitting: Obstetrics and Gynecology

## 2024-04-07 ENCOUNTER — Encounter: Payer: Self-pay | Admitting: Certified Nurse Midwife

## 2024-04-12 ENCOUNTER — Other Ambulatory Visit: Payer: Self-pay

## 2024-04-12 ENCOUNTER — Encounter: Payer: Self-pay | Admitting: Advanced Practice Midwife

## 2024-04-12 ENCOUNTER — Ambulatory Visit (INDEPENDENT_AMBULATORY_CARE_PROVIDER_SITE_OTHER): Admitting: Advanced Practice Midwife

## 2024-04-12 DIAGNOSIS — Z8632 Personal history of gestational diabetes: Secondary | ICD-10-CM | POA: Diagnosis not present

## 2024-04-12 DIAGNOSIS — O24415 Gestational diabetes mellitus in pregnancy, controlled by oral hypoglycemic drugs: Secondary | ICD-10-CM

## 2024-04-12 DIAGNOSIS — O165 Unspecified maternal hypertension, complicating the puerperium: Secondary | ICD-10-CM

## 2024-04-12 MED ORDER — AMLODIPINE BESYLATE 10 MG PO TABS: 10.0000 mg | ORAL_TABLET | Freq: Every day | ORAL | 2 refills | Status: AC

## 2024-04-12 NOTE — Patient Instructions (Signed)
 [facility] to find primary care provider (Primary Care, Internal Medicine or Family Medicine)

## 2024-04-12 NOTE — Progress Notes (Signed)
 Post Partum Visit Note  Ashley Weber is a 28 y.o. G43P1001 female who presents for a postpartum visit. She is 6 weeks postpartum following a normal spontaneous vaginal delivery.  I have fully reviewed the prenatal and intrapartum course. The delivery was at 37.3 gestational weeks.  Anesthesia: epidural. Postpartum course has been well. Baby is doing well. Baby is feeding by bottle - Similac Neosure. Bleeding thin lochia. Bowel function is normal. Bladder function is normal. Patient is not sexually active. Contraception method is Depo-Provera  injections. Postpartum depression screening: negative.   The pregnancy intention screening data noted above was reviewed. Potential methods of contraception were discussed. The patient elected to proceed with No data recorded.   Edinburgh Postnatal Depression Scale - 04/12/24 0912       Edinburgh Postnatal Depression Scale:  In the Past 7 Days   I have been able to laugh and see the funny side of things. 0    I have looked forward with enjoyment to things. 0    I have blamed myself unnecessarily when things went wrong. 2    I have been anxious or worried for no good reason. 0    I have felt scared or panicky for no good reason. 0    Things have been getting on top of me. 0    I have been so unhappy that I have had difficulty sleeping. 0    I have felt sad or miserable. 0    I have been so unhappy that I have been crying. 0    The thought of harming myself has occurred to me. 0    Edinburgh Postnatal Depression Scale Total 2          Health Maintenance Due  Topic Date Due   Pneumococcal Vaccine (1 of 1 - PPSV23, PCV20, or PCV21) 06/06/2004   COVID-19 Vaccine (2 - 2024-25 season) 04/19/2023   INFLUENZA VACCINE  03/18/2024    The following portions of the patient's history were reviewed and updated as appropriate: allergies, current medications, past family history, past medical history, past social history, past surgical history, and problem  list.  Review of Systems Pertinent items noted in HPI and remainder of comprehensive ROS otherwise negative.  Objective:  BP (!) 137/92   Pulse 74   Ht 5' 8 (1.727 m)   LMP 06/08/2023   Breastfeeding Yes   BMI 38.10 kg/m    General:  alert and cooperative   Breasts:  normal  Lungs: clear to auscultation bilaterally  Heart:  regular rate and rhythm, S1, S2 normal, no murmur, click, rub or gallop  Abdomen: Deferred   Wound N/A  GU exam:  not indicated       Assessment:    1. Postpartum care and examination Doing well. Breast and formula feeding. Adjusting well with new baby. Currently on Depo  2. Hx of gestational diabetes mellitus, not currently pregnant Postpartum GTT today  3. Postpartum hypertension Follow up 1 week for BP check - amLODipine  (NORVASC ) 10 MG tablet; Take 1 tablet (10 mg total) by mouth daily.  Dispense: 30 tablet; Refill: 2  Normal  postpartum exam.   Plan:   Essential components of care per ACOG recommendations:  1.  Mood and well being: Patient with negative depression screening today. Reviewed local resources for support.  - Patient tobacco use? No.   - hx of drug use? No.    2. Infant care and feeding:  -Patient currently breastmilk feeding? No.  -Social determinants  of health (SDOH) reviewed in EPIC. No concerns.The following needs were identified maintaining a routine with pumping to maintain milk supply.  3. Sexuality, contraception and birth spacing - Patient does not want a pregnancy in the next year.  Desired family size is 3 children.  - Reviewed reproductive life planning. Reviewed contraceptive methods based on pt preferences and effectiveness.  Patient desired Hormonal Injection today.   - Discussed birth spacing of 18 months  4. Sleep and fatigue -Encouraged family/partner/community support of 4 hrs of uninterrupted sleep to help with mood and fatigue  5. Physical Recovery  - Discussed patients delivery and complications. She  describes her labor as good. - Patient had a Vaginal, no problems at delivery. Patient had a 1st degree laceration. Perineal healing reviewed. Patient expressed understanding - Patient has urinary incontinence? No. - Patient is not safe to resume physical and sexual activity  6.  Health Maintenance - HM due items addressed Yes - Last pap smear  Diagnosis  Date Value Ref Range Status  10/06/2023   Final   - Negative for intraepithelial lesion or malignancy (NILM)   Pap smear not done at today's visit.  -Breast Cancer screening indicated? No.   7. Chronic Disease/Pregnancy Condition follow up: Postpartum GTT today  - PCP follow up  Derrek JINNY Freund, NP [facility] [employer], [facility]

## 2024-04-12 NOTE — Progress Notes (Signed)
 Duplicate note

## 2024-04-13 LAB — GLUCOSE TOLERANCE, 2 HOURS
Glucose, 2 hour: 111 mg/dL (ref 70–139)
Glucose, GTT - Fasting: 96 mg/dL (ref 70–99)

## 2024-04-19 ENCOUNTER — Ambulatory Visit: Payer: Self-pay | Admitting: Obstetrics and Gynecology

## 2024-04-26 ENCOUNTER — Telehealth: Payer: Self-pay | Admitting: *Deleted

## 2024-04-26 ENCOUNTER — Ambulatory Visit

## 2024-04-26 NOTE — Telephone Encounter (Signed)
 Ashley Weber Nurse visit this am for BP check. I called and notified her. I asked if we can reschedule. She requested it to be done next week. I rescheduled for 05/03/24. She voices understanding. Rock Skip PEAK

## 2024-05-03 ENCOUNTER — Ambulatory Visit

## 2024-06-05 ENCOUNTER — Encounter: Payer: Self-pay | Admitting: Advanced Practice Midwife

## 2024-06-29 ENCOUNTER — Ambulatory Visit (INDEPENDENT_AMBULATORY_CARE_PROVIDER_SITE_OTHER)

## 2024-06-29 ENCOUNTER — Other Ambulatory Visit: Payer: Self-pay

## 2024-06-29 VITALS — BP 145/93 | HR 68 | Ht 68.0 in | Wt 262.0 lb

## 2024-06-29 DIAGNOSIS — Z3042 Encounter for surveillance of injectable contraceptive: Secondary | ICD-10-CM

## 2024-06-29 DIAGNOSIS — Z3202 Encounter for pregnancy test, result negative: Secondary | ICD-10-CM

## 2024-06-29 LAB — POCT PREGNANCY, URINE: Preg Test, Ur: NEGATIVE

## 2024-06-29 MED ORDER — MEDROXYPROGESTERONE ACETATE 150 MG/ML IM SUSY
150.0000 mg | PREFILLED_SYRINGE | Freq: Once | INTRAMUSCULAR | Status: AC
  Administered 2024-06-29: 150 mg via INTRAMUSCULAR

## 2024-06-29 NOTE — Patient Instructions (Signed)

## 2024-06-29 NOTE — Progress Notes (Signed)
 Ashley Weber here for Depo-Provera  Injection. Last injection administered 02/26/24. UPT result was negative and patient states it has been 4 weeks since unprotected intercourse. Advised patient to use additional contraception for 2 weeks; patient voices understanding.Injection administered today without complication. Patient will return in 3 months for next injection between 09/14/24 and 09/28/24.  Patient has no further questions or concerns.   Devon, RN 06/29/2024

## 2024-09-14 ENCOUNTER — Ambulatory Visit
